# Patient Record
Sex: Female | Born: 1965 | Race: White | Hispanic: No | Marital: Married | Smoking: Never smoker
Health system: Southern US, Community
[De-identification: ages and names within clinical notes are randomized; demographics above are authoritative.]

## PROBLEM LIST (undated history)

## (undated) ENCOUNTER — Emergency Department (HOSPITAL_COMMUNITY)
Discharge status: Against Medical Advice | Payer: Medicaid Other | Attending: Emergency Medicine | Admitting: Emergency Medicine

## (undated) DIAGNOSIS — T8859XA Other complications of anesthesia, initial encounter: Secondary | ICD-10-CM

## (undated) DIAGNOSIS — F419 Anxiety disorder, unspecified: Secondary | ICD-10-CM

## (undated) DIAGNOSIS — R0600 Dyspnea, unspecified: Secondary | ICD-10-CM

## (undated) DIAGNOSIS — I2510 Atherosclerotic heart disease of native coronary artery without angina pectoris: Secondary | ICD-10-CM

## (undated) DIAGNOSIS — R569 Unspecified convulsions: Secondary | ICD-10-CM

## (undated) DIAGNOSIS — K219 Gastro-esophageal reflux disease without esophagitis: Secondary | ICD-10-CM

## (undated) DIAGNOSIS — G8929 Other chronic pain: Secondary | ICD-10-CM

## (undated) DIAGNOSIS — R7303 Prediabetes: Secondary | ICD-10-CM

## (undated) DIAGNOSIS — E7800 Pure hypercholesterolemia, unspecified: Secondary | ICD-10-CM

## (undated) DIAGNOSIS — F32A Depression, unspecified: Secondary | ICD-10-CM

## (undated) DIAGNOSIS — D332 Benign neoplasm of brain, unspecified: Secondary | ICD-10-CM

## (undated) DIAGNOSIS — F329 Major depressive disorder, single episode, unspecified: Secondary | ICD-10-CM

## (undated) DIAGNOSIS — K5792 Diverticulitis of intestine, part unspecified, without perforation or abscess without bleeding: Secondary | ICD-10-CM

## (undated) DIAGNOSIS — I10 Essential (primary) hypertension: Secondary | ICD-10-CM

## (undated) DIAGNOSIS — F1190 Opioid use, unspecified, uncomplicated: Secondary | ICD-10-CM

## (undated) HISTORY — PX: CARPAL TUNNEL RELEASE: SHX101

## (undated) HISTORY — PX: ABDOMINAL HYSTERECTOMY: SHX81

## (undated) HISTORY — PX: CHOLECYSTECTOMY: SHX55

## (undated) HISTORY — PX: TUBAL LIGATION: SHX77

## (undated) HISTORY — PX: ENDOMETRIAL ABLATION: SHX621

## (undated) HISTORY — PX: BRAIN TUMOR EXCISION: SHX577

---

## 2005-10-18 ENCOUNTER — Ambulatory Visit: Payer: Self-pay | Admitting: Family Medicine

## 2006-04-08 ENCOUNTER — Ambulatory Visit: Payer: Self-pay | Admitting: Family Medicine

## 2006-04-15 ENCOUNTER — Ambulatory Visit: Payer: Self-pay | Admitting: Family Medicine

## 2006-04-23 ENCOUNTER — Ambulatory Visit: Payer: Self-pay | Admitting: Family Medicine

## 2006-06-20 ENCOUNTER — Encounter (INDEPENDENT_AMBULATORY_CARE_PROVIDER_SITE_OTHER): Payer: Self-pay | Admitting: Specialist

## 2006-06-20 ENCOUNTER — Observation Stay (HOSPITAL_COMMUNITY): Admission: RE | Admit: 2006-06-20 | Discharge: 2006-06-22 | Payer: Self-pay | Admitting: Obstetrics and Gynecology

## 2006-06-24 ENCOUNTER — Ambulatory Visit: Payer: Self-pay | Admitting: Family Medicine

## 2010-05-23 ENCOUNTER — Emergency Department (HOSPITAL_COMMUNITY): Admission: EM | Admit: 2010-05-23 | Discharge: 2010-05-23 | Payer: Self-pay | Admitting: Emergency Medicine

## 2010-06-06 ENCOUNTER — Emergency Department (HOSPITAL_COMMUNITY): Admission: EM | Admit: 2010-06-06 | Discharge: 2010-06-06 | Payer: Self-pay | Admitting: Emergency Medicine

## 2010-06-07 ENCOUNTER — Emergency Department (HOSPITAL_COMMUNITY): Admission: EM | Admit: 2010-06-07 | Discharge: 2010-06-07 | Payer: Self-pay | Admitting: Family Medicine

## 2010-06-08 ENCOUNTER — Inpatient Hospital Stay (HOSPITAL_COMMUNITY): Admission: EM | Admit: 2010-06-08 | Discharge: 2010-06-09 | Payer: Self-pay | Admitting: Emergency Medicine

## 2010-12-17 ENCOUNTER — Emergency Department (HOSPITAL_COMMUNITY)
Admission: EM | Admit: 2010-12-17 | Discharge: 2010-12-17 | Payer: Self-pay | Source: Home / Self Care | Admitting: Emergency Medicine

## 2010-12-25 LAB — COMPREHENSIVE METABOLIC PANEL
ALT: 23 U/L (ref 0–35)
AST: 23 U/L (ref 0–37)
Albumin: 3.5 g/dL (ref 3.5–5.2)
Alkaline Phosphatase: 55 U/L (ref 39–117)
BUN: 14 mg/dL (ref 6–23)
CO2: 26 mEq/L (ref 19–32)
Calcium: 8.4 mg/dL (ref 8.4–10.5)
Chloride: 103 mEq/L (ref 96–112)
Creatinine, Ser: 0.75 mg/dL (ref 0.4–1.2)
GFR calc Af Amer: >60 mL/min (ref >60)
GFR calc non Af Amer: >60 mL/min (ref >60)
Glucose, Bld: 94 mg/dL (ref 70–99)
Potassium: 4.3 mEq/L (ref 3.5–5.1)
Sodium: 136 mEq/L (ref 135–145)
Total Bilirubin: 0.4 mg/dL (ref 0.3–1.2)
Total Protein: 6.2 g/dL (ref 6.0–8.3)

## 2010-12-25 LAB — URINALYSIS, ROUTINE W REFLEX MICROSCOPIC
Bilirubin Urine: NEGATIVE
Hgb urine dipstick: NEGATIVE
Ketones, ur: NEGATIVE mg/dL
Nitrite: NEGATIVE
Protein, ur: NEGATIVE mg/dL
Specific Gravity, Urine: >1.03 — ABNORMAL HIGH (ref 1.005–1.030)
Urine Glucose, Fasting: NEGATIVE mg/dL
Urobilinogen, UA: 0.2 mg/dL (ref 0.0–1.0)
pH: 5.5 (ref 5.0–8.0)

## 2010-12-25 LAB — DIFFERENTIAL
Basophils Absolute: 0.1 10*3/uL (ref 0.0–0.1)
Basophils Relative: 1 % (ref 0–1)
Eosinophils Absolute: 0.1 10*3/uL (ref 0.0–0.7)
Eosinophils Relative: 1 % (ref 0–5)
Lymphocytes Relative: 37 % (ref 12–46)
Lymphs Abs: 2.4 10*3/uL (ref 0.7–4.0)
Monocytes Absolute: 0.4 10*3/uL (ref 0.1–1.0)
Monocytes Relative: 6 % (ref 3–12)
Neutro Abs: 3.5 10*3/uL (ref 1.7–7.7)
Neutrophils Relative %: 54 % (ref 43–77)

## 2010-12-25 LAB — CBC
HCT: 37.9 % (ref 36.0–46.0)
Hemoglobin: 13.6 g/dL (ref 12.0–15.0)
MCH: 30.4 pg (ref 26.0–34.0)
MCHC: 35.9 g/dL (ref 30.0–36.0)
MCV: 84.8 fL (ref 78.0–100.0)
Platelets: 195 10*3/uL (ref 150–400)
RBC: 4.47 MIL/uL (ref 3.87–5.11)
RDW: 13 % (ref 11.5–15.5)
WBC: 6.5 10*3/uL (ref 4.0–10.5)

## 2010-12-25 LAB — GLUCOSE, CAPILLARY: Glucose-Capillary: 98 mg/dL (ref 70–99)

## 2011-02-25 LAB — BASIC METABOLIC PANEL
BUN: 7 mg/dL (ref 6–23)
BUN: 13 mg/dL (ref 6–23)
CO2: 28 mEq/L (ref 19–32)
CO2: 26 mEq/L (ref 19–32)
Calcium: 8.2 mg/dL — ABNORMAL LOW (ref 8.4–10.5)
Calcium: 9.2 mg/dL (ref 8.4–10.5)
Chloride: 106 mEq/L (ref 96–112)
Chloride: 105 mEq/L (ref 96–112)
Creatinine, Ser: 0.83 mg/dL (ref 0.4–1.2)
Creatinine, Ser: 0.66 mg/dL (ref 0.4–1.2)
GFR calc Af Amer: >60 mL/min (ref >60)
GFR calc Af Amer: >60 mL/min (ref >60)
GFR calc non Af Amer: >60 mL/min (ref >60)
GFR calc non Af Amer: >60 mL/min (ref >60)
Glucose, Bld: 103 mg/dL — ABNORMAL HIGH (ref 70–99)
Glucose, Bld: 97 mg/dL (ref 70–99)
Potassium: 3.9 mEq/L (ref 3.5–5.1)
Potassium: 3.9 mEq/L (ref 3.5–5.1)
Sodium: 140 mEq/L (ref 135–145)
Sodium: 136 mEq/L (ref 135–145)

## 2011-02-25 LAB — CBC
HCT: 35.6 % — ABNORMAL LOW (ref 36.0–46.0)
HCT: 41.8 % (ref 36.0–46.0)
HCT: 36.8 % (ref 36.0–46.0)
HCT: 42.8 % (ref 36.0–46.0)
Hemoglobin: 12.4 g/dL (ref 12.0–15.0)
Hemoglobin: 14.4 g/dL (ref 12.0–15.0)
Hemoglobin: 12.9 g/dL (ref 12.0–15.0)
Hemoglobin: 14.7 g/dL (ref 12.0–15.0)
MCH: 30.8 pg (ref 26.0–34.0)
MCH: 29.8 pg (ref 26.0–34.0)
MCH: 30.1 pg (ref 26.0–34.0)
MCH: 30.6 pg (ref 26.0–34.0)
MCHC: 34.9 g/dL (ref 30.0–36.0)
MCHC: 34.4 g/dL (ref 30.0–36.0)
MCHC: 34.2 g/dL (ref 30.0–36.0)
MCHC: 34.9 g/dL (ref 30.0–36.0)
MCV: 88.4 fL (ref 78.0–100.0)
MCV: 86.4 fL (ref 78.0–100.0)
MCV: 87.6 fL (ref 78.0–100.0)
MCV: 87.9 fL (ref 78.0–100.0)
Platelets: 167 10*3/uL (ref 150–400)
Platelets: 215 10*3/uL (ref 150–400)
Platelets: 161 10*3/uL (ref 150–400)
Platelets: 213 10*3/uL (ref 150–400)
RBC: 4.03 MIL/uL (ref 3.87–5.11)
RBC: 4.84 MIL/uL (ref 3.87–5.11)
RBC: 4.21 MIL/uL (ref 3.87–5.11)
RBC: 4.87 MIL/uL (ref 3.87–5.11)
RDW: 13.3 % (ref 11.5–15.5)
RDW: 13.4 % (ref 11.5–15.5)
RDW: 13.3 % (ref 11.5–15.5)
RDW: 13.5 % (ref 11.5–15.5)
WBC: 6.2 10*3/uL (ref 4.0–10.5)
WBC: 8.4 10*3/uL (ref 4.0–10.5)
WBC: 14.1 10*3/uL — ABNORMAL HIGH (ref 4.0–10.5)
WBC: 8.9 10*3/uL (ref 4.0–10.5)

## 2011-02-25 LAB — COMPREHENSIVE METABOLIC PANEL
ALT: 27 U/L (ref 0–35)
ALT: 31 U/L (ref 0–35)
AST: 31 U/L (ref 0–37)
AST: 37 U/L (ref 0–37)
Albumin: 3.3 g/dL — ABNORMAL LOW (ref 3.5–5.2)
Albumin: 4.1 g/dL (ref 3.5–5.2)
Alkaline Phosphatase: 62 U/L (ref 39–117)
Alkaline Phosphatase: 66 U/L (ref 39–117)
BUN: 11 mg/dL (ref 6–23)
BUN: 9 mg/dL (ref 6–23)
CO2: 23 mEq/L (ref 19–32)
CO2: 27 mEq/L (ref 19–32)
Calcium: 8.2 mg/dL — ABNORMAL LOW (ref 8.4–10.5)
Calcium: 9 mg/dL (ref 8.4–10.5)
Chloride: 100 mEq/L (ref 96–112)
Chloride: 101 mEq/L (ref 96–112)
Creatinine, Ser: 0.79 mg/dL (ref 0.4–1.2)
Creatinine, Ser: 0.8 mg/dL (ref 0.4–1.2)
GFR calc Af Amer: >60 mL/min (ref >60)
GFR calc Af Amer: >60 mL/min (ref >60)
GFR calc non Af Amer: >60 mL/min (ref >60)
GFR calc non Af Amer: >60 mL/min (ref >60)
Glucose, Bld: 107 mg/dL — ABNORMAL HIGH (ref 70–99)
Glucose, Bld: 97 mg/dL (ref 70–99)
Potassium: 3.6 mEq/L (ref 3.5–5.1)
Potassium: 4.8 mEq/L (ref 3.5–5.1)
Sodium: 133 mEq/L — ABNORMAL LOW (ref 135–145)
Sodium: 135 mEq/L (ref 135–145)
Total Bilirubin: 1.1 mg/dL (ref 0.3–1.2)
Total Bilirubin: 1.7 mg/dL — ABNORMAL HIGH (ref 0.3–1.2)
Total Protein: 6.3 g/dL (ref 6.0–8.3)
Total Protein: 7.2 g/dL (ref 6.0–8.3)

## 2011-02-25 LAB — URINALYSIS, ROUTINE W REFLEX MICROSCOPIC
Bilirubin Urine: NEGATIVE
Glucose, UA: NEGATIVE mg/dL
Glucose, UA: NEGATIVE mg/dL
Hgb urine dipstick: NEGATIVE
Hgb urine dipstick: NEGATIVE
Ketones, ur: NEGATIVE mg/dL
Ketones, ur: 15 mg/dL — AB
Nitrite: NEGATIVE
Nitrite: NEGATIVE
Protein, ur: NEGATIVE mg/dL
Protein, ur: NEGATIVE mg/dL
Specific Gravity, Urine: >1.03 — ABNORMAL HIGH (ref 1.005–1.030)
Specific Gravity, Urine: 1.028 (ref 1.005–1.030)
Urobilinogen, UA: 0.2 mg/dL (ref 0.0–1.0)
Urobilinogen, UA: 0.2 mg/dL (ref 0.0–1.0)
pH: 5.5 (ref 5.0–8.0)
pH: 5.5 (ref 5.0–8.0)

## 2011-02-25 LAB — DIFFERENTIAL
Basophils Absolute: 0.1 10*3/uL (ref 0.0–0.1)
Basophils Absolute: 0 10*3/uL (ref 0.0–0.1)
Basophils Absolute: 0.1 10*3/uL (ref 0.0–0.1)
Basophils Relative: 1 % (ref 0–1)
Basophils Relative: 1 % (ref 0–1)
Basophils Relative: 1 % (ref 0–1)
Eosinophils Absolute: 0.1 10*3/uL (ref 0.0–0.7)
Eosinophils Absolute: 0 10*3/uL (ref 0.0–0.7)
Eosinophils Absolute: 0 10*3/uL (ref 0.0–0.7)
Eosinophils Relative: 1 % (ref 0–5)
Eosinophils Relative: 0 % (ref 0–5)
Eosinophils Relative: 1 % (ref 0–5)
Lymphocytes Relative: 27 % (ref 12–46)
Lymphocytes Relative: 11 % — ABNORMAL LOW (ref 12–46)
Lymphocytes Relative: 22 % (ref 12–46)
Lymphs Abs: 2.2 10*3/uL (ref 0.7–4.0)
Lymphs Abs: 1.5 10*3/uL (ref 0.7–4.0)
Lymphs Abs: 2 10*3/uL (ref 0.7–4.0)
Monocytes Absolute: 0.6 10*3/uL (ref 0.1–1.0)
Monocytes Absolute: 0.6 10*3/uL (ref 0.1–1.0)
Monocytes Absolute: 0.6 10*3/uL (ref 0.1–1.0)
Monocytes Relative: 7 % (ref 3–12)
Monocytes Relative: 4 % (ref 3–12)
Monocytes Relative: 7 % (ref 3–12)
Neutro Abs: 5.5 10*3/uL (ref 1.7–7.7)
Neutro Abs: 11.9 10*3/uL — ABNORMAL HIGH (ref 1.7–7.7)
Neutro Abs: 6.2 10*3/uL (ref 1.7–7.7)
Neutrophils Relative %: 65 % (ref 43–77)
Neutrophils Relative %: 70 % (ref 43–77)
Neutrophils Relative %: 85 % — ABNORMAL HIGH (ref 43–77)

## 2011-02-25 LAB — URINE CULTURE: Colony Count: 50000

## 2011-02-25 LAB — LIPASE, BLOOD: Lipase: 25 U/L (ref 11–59)

## 2011-02-26 LAB — COMPREHENSIVE METABOLIC PANEL
ALT: 27 U/L (ref 0–35)
Alkaline Phosphatase: 67 U/L (ref 39–117)
CO2: 22 mEq/L (ref 19–32)
Chloride: 105 mEq/L (ref 96–112)
Glucose, Bld: 91 mg/dL (ref 70–99)
Potassium: 3.8 mEq/L (ref 3.5–5.1)
Sodium: 137 mEq/L (ref 135–145)
Total Bilirubin: 0.7 mg/dL (ref 0.3–1.2)
Total Protein: 7 g/dL (ref 6.0–8.3)

## 2011-02-26 LAB — CBC
Hemoglobin: 14.4 g/dL (ref 12.0–15.0)
RBC: 4.79 MIL/uL (ref 3.87–5.11)
WBC: 7.4 10*3/uL (ref 4.0–10.5)

## 2011-02-26 LAB — URINALYSIS, ROUTINE W REFLEX MICROSCOPIC
Bilirubin Urine: NEGATIVE
Glucose, UA: NEGATIVE mg/dL
Hgb urine dipstick: NEGATIVE
Ketones, ur: NEGATIVE mg/dL
Nitrite: NEGATIVE
Protein, ur: NEGATIVE mg/dL
Specific Gravity, Urine: >1.03 — ABNORMAL HIGH (ref 1.005–1.030)
Urobilinogen, UA: 0.2 mg/dL (ref 0.0–1.0)
pH: 5 (ref 5.0–8.0)

## 2011-02-26 LAB — POCT CARDIAC MARKERS
CKMB, poc: <1 ng/mL — ABNORMAL LOW (ref 1.0–8.0)
Myoglobin, poc: 60.2 ng/mL (ref 12–200)
Troponin i, poc: <0.05 ng/mL (ref 0.00–0.09)

## 2011-02-26 LAB — DIFFERENTIAL
Basophils Relative: 1 % (ref 0–1)
Eosinophils Absolute: 0.1 10*3/uL (ref 0.0–0.7)
Monocytes Relative: 6 % (ref 3–12)
Neutrophils Relative %: 67 % (ref 43–77)

## 2011-02-26 LAB — GC/CHLAMYDIA PROBE AMP, GENITAL: GC Probe Amp, Genital: NEGATIVE

## 2011-02-26 LAB — WET PREP, GENITAL

## 2011-04-06 IMAGING — CT CT ABD-PELV W/ CM
3 of 5 series · 15 of 32 positions shown, 19 images · IV contrast (water & 100 ML OMNI 300)
Comparison: 05/23/2010

CLINICAL DATA: Left-sided abdominal pain.

CT ABDOMEN AND PELVIS WITH CONTRAST
TECHNIQUE: Multidetector CT imaging of the abdomen and pelvis was
performed following the standard protocol during bolus
administration of intravenous contrast.
Contrast: 100 ml Omnipaque 300 IV.

[Series 2: routine abdomen · axial · 0.81mm/px · z∈[-390,-130]mm · 3 of 102 slices shown, 7 images]
[im 26/102  soft-tissue]
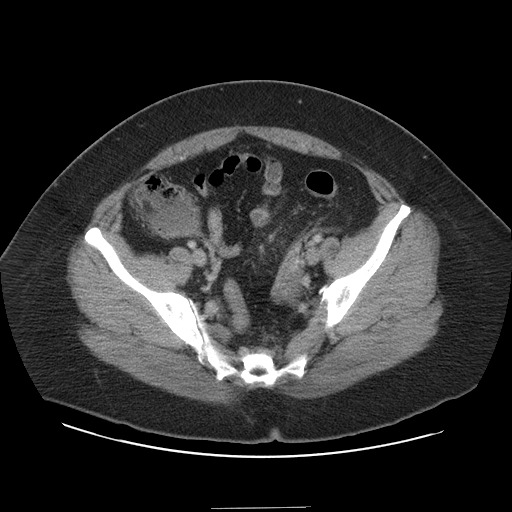
[im 26/102  lung]
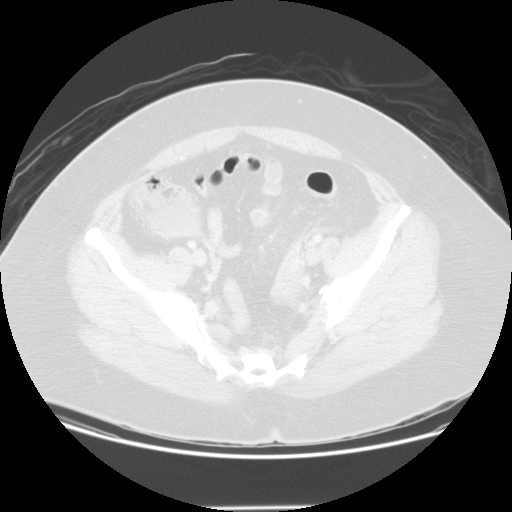
[im 26/102  bone]
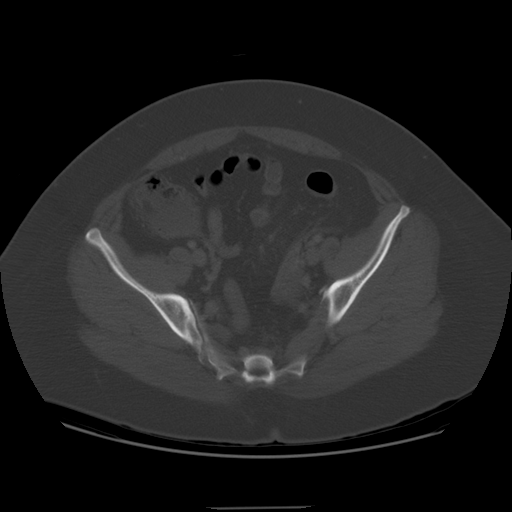
[im 51/102  soft-tissue]
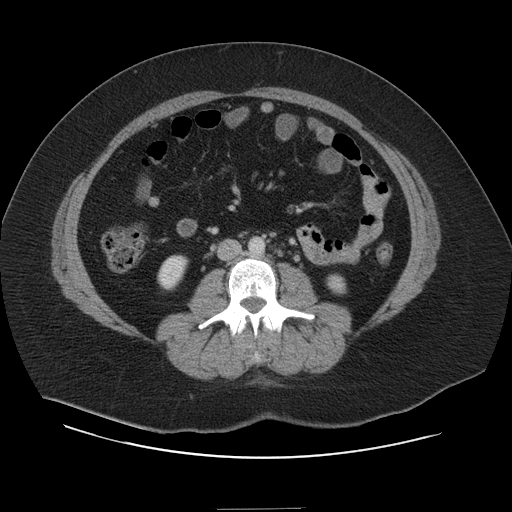
[im 51/102  lung]
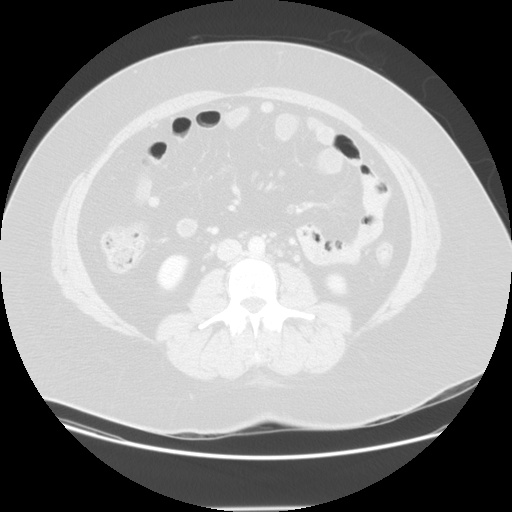
[im 76/102  soft-tissue]
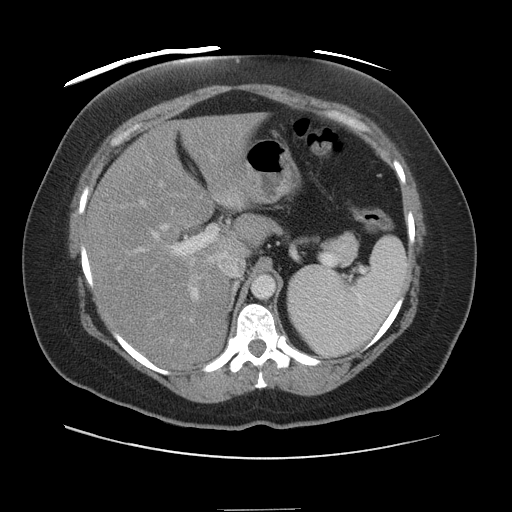
[im 76/102  lung]
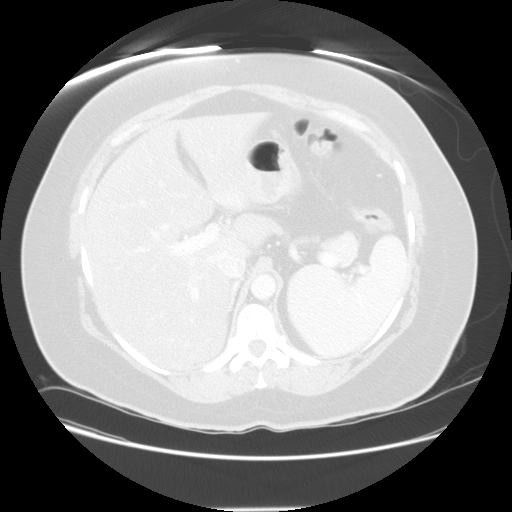

[Series 400: reformatted · sagittal · 1.06mm/px · 8 of 206 slices shown (1 of 2)]
[im 19/206  soft-tissue]
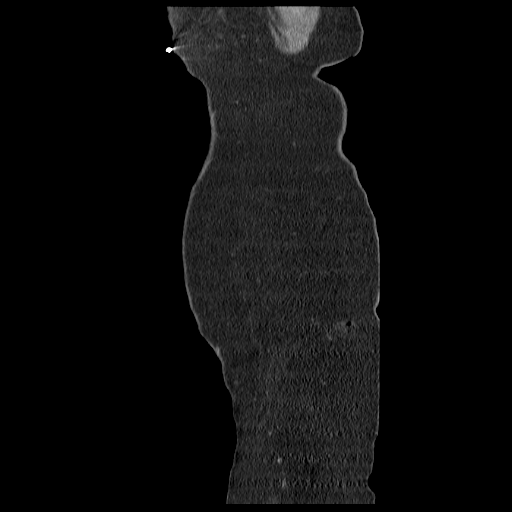
[im 38/206  soft-tissue]
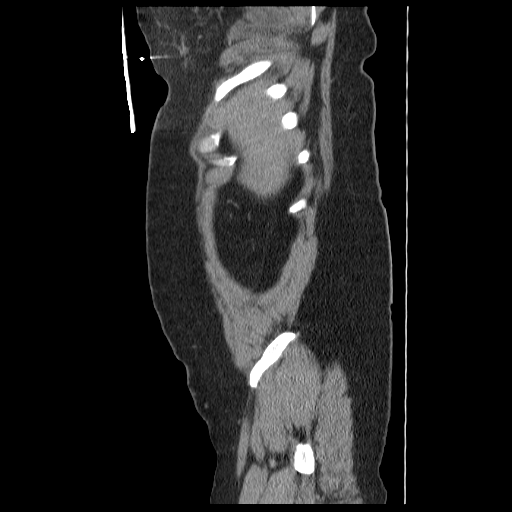
[im 75/206  soft-tissue]
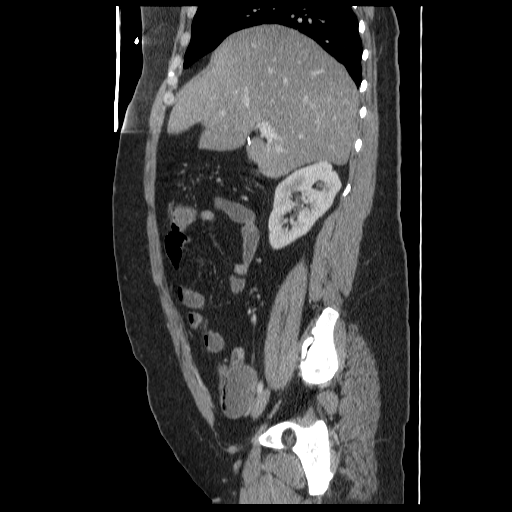
[im 94/206  soft-tissue]
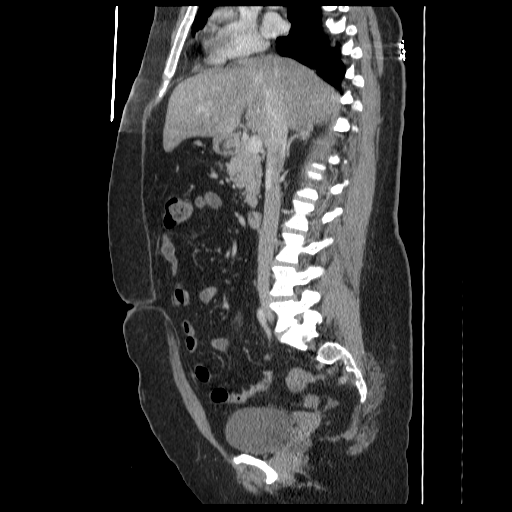
[im 112/206  soft-tissue]
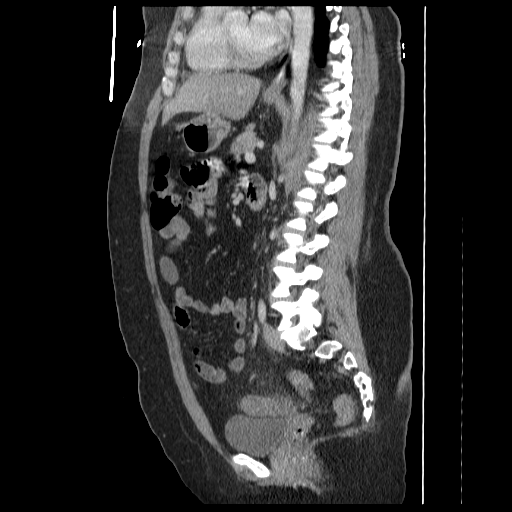
[im 131/206  soft-tissue]
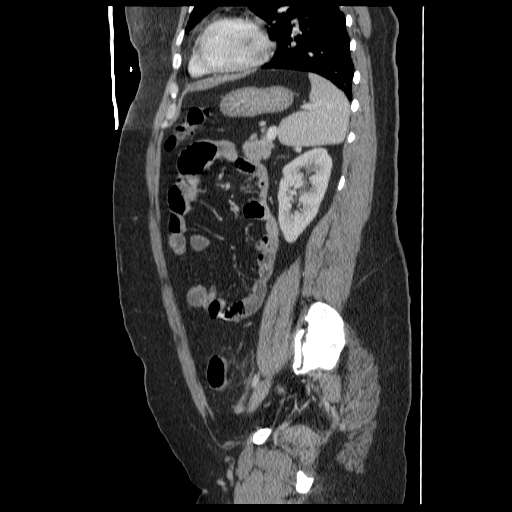
[im 168/206  soft-tissue]
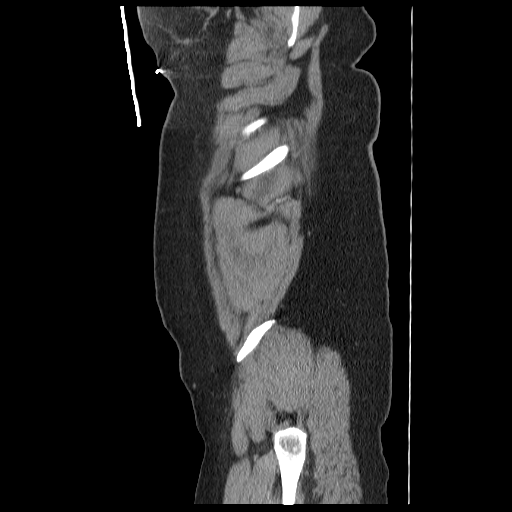
[im 187/206  soft-tissue]
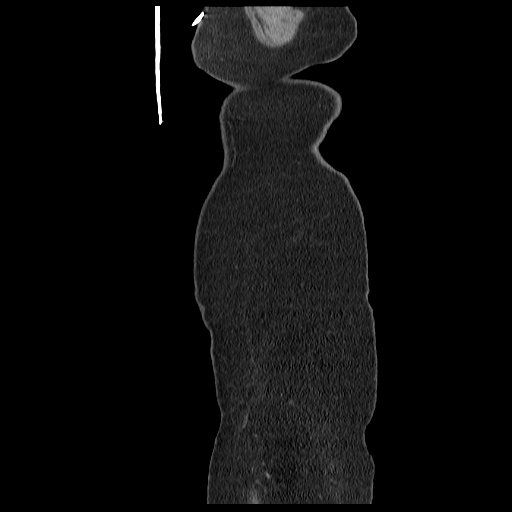

[Series 401: reformatted · coronal · 1.06mm/px · 4 of 176 slices shown (2 of 2)]
[im 20/176  soft-tissue]
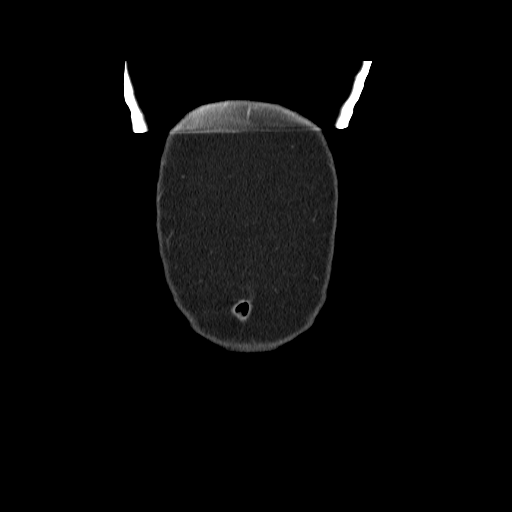
[im 39/176  soft-tissue]
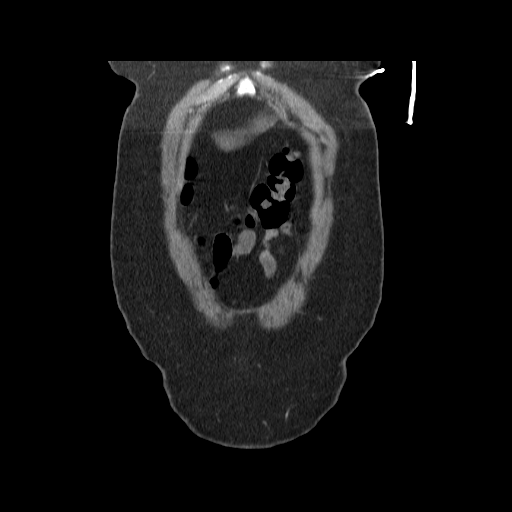
[im 59/176  soft-tissue]
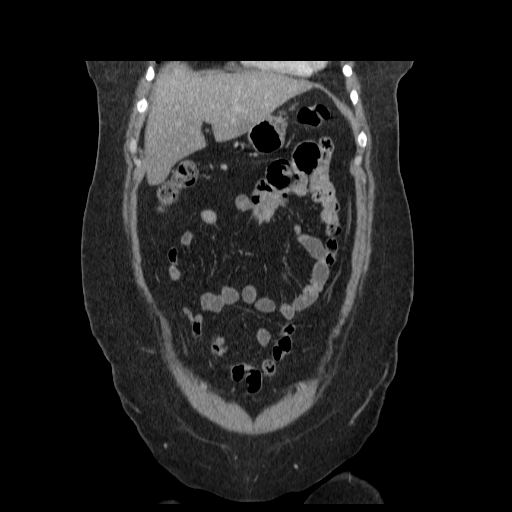
[im 78/176  soft-tissue]
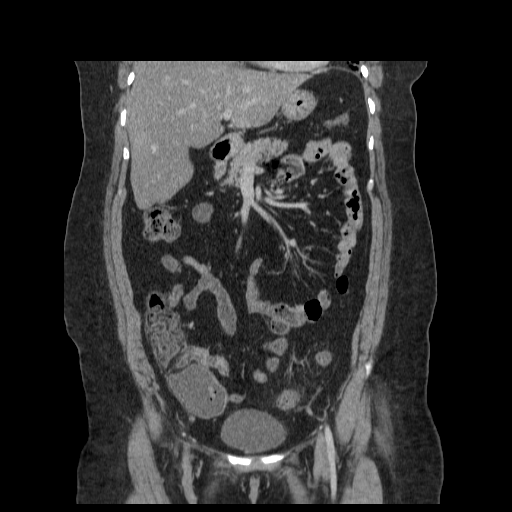

[15 of 32 positions shown; findings below may reference images not displayed]

FINDINGS: Minimal dependent atelectasis in the lung bases.  No
effusions.

There is fatty infiltration of the liver.  Small low-density lesion
is seen anteriorly in the liver, likely small cyst, unchanged.
Prior cholecystectomy.  Spleen, pancreas, adrenals and kidneys are
unremarkable.

There is sigmoid diverticulosis.  The previously seen inflammatory
change around the proximal sigmoid colon has improved.  More
distally in the mid sigmoid colon, there is new inflammatory change
compatible with a new area of diverticulitis.  No free fluid or
free air.

Prior hysterectomy.  Small left ovarian cyst present, similar prior
study.  Right ovary is unremarkable.  Small bowel is decompressed,
grossly unremarkable.

Aorta is normal caliber. No acute bony abnormality.
IMPRESSION: There is a new area of diverticulitis more distally in the sigmoid
colon with surrounding inflammatory change.  No abscess or free
air.

Fatty infiltration of the liver.

## 2011-04-09 ENCOUNTER — Emergency Department (HOSPITAL_COMMUNITY)
Admission: EM | Admit: 2011-04-09 | Discharge: 2011-04-10 | Disposition: A | Discharge status: Home / Self Care | Payer: Medicaid Other | Attending: Emergency Medicine | Admitting: Emergency Medicine

## 2011-04-09 DIAGNOSIS — M542 Cervicalgia: Secondary | ICD-10-CM | POA: Insufficient documentation

## 2011-04-09 DIAGNOSIS — R51 Headache: Secondary | ICD-10-CM | POA: Insufficient documentation

## 2011-04-09 DIAGNOSIS — F329 Major depressive disorder, single episode, unspecified: Secondary | ICD-10-CM | POA: Insufficient documentation

## 2011-04-09 DIAGNOSIS — Z9889 Other specified postprocedural states: Secondary | ICD-10-CM | POA: Insufficient documentation

## 2011-04-09 DIAGNOSIS — M25519 Pain in unspecified shoulder: Secondary | ICD-10-CM | POA: Insufficient documentation

## 2011-04-09 DIAGNOSIS — E785 Hyperlipidemia, unspecified: Secondary | ICD-10-CM | POA: Insufficient documentation

## 2011-04-09 DIAGNOSIS — T148XXA Other injury of unspecified body region, initial encounter: Secondary | ICD-10-CM | POA: Insufficient documentation

## 2011-04-09 DIAGNOSIS — Y929 Unspecified place or not applicable: Secondary | ICD-10-CM | POA: Insufficient documentation

## 2011-04-09 DIAGNOSIS — X58XXXA Exposure to other specified factors, initial encounter: Secondary | ICD-10-CM | POA: Insufficient documentation

## 2011-04-09 DIAGNOSIS — F3289 Other specified depressive episodes: Secondary | ICD-10-CM | POA: Insufficient documentation

## 2011-04-09 LAB — DIFFERENTIAL
Basophils Absolute: 0.1 10*3/uL (ref 0.0–0.1)
Lymphocytes Relative: 43 % (ref 12–46)
Monocytes Absolute: 0.4 10*3/uL (ref 0.1–1.0)
Neutro Abs: 3.1 10*3/uL (ref 1.7–7.7)

## 2011-04-09 LAB — BASIC METABOLIC PANEL
CO2: 26 mEq/L (ref 19–32)
Chloride: 103 mEq/L (ref 96–112)
Glucose, Bld: 87 mg/dL (ref 70–99)
Potassium: 3.4 mEq/L — ABNORMAL LOW (ref 3.5–5.1)
Sodium: 137 mEq/L (ref 135–145)

## 2011-04-09 LAB — CBC
HCT: 37.9 % (ref 36.0–46.0)
Hemoglobin: 13.2 g/dL (ref 12.0–15.0)
WBC: 6.5 10*3/uL (ref 4.0–10.5)

## 2011-04-27 NOTE — Op Note (Signed)
[name], [name]              ACCOUNT NO.:  [id number]   MEDICAL RECORD NO.:  [id number]          PATIENT TYPE:  OBV   LOCATION:  A428                          FACILITY:  [facility]   PHYSICIAN:  Tilda Burrow, M.D. DATE OF BIRTH:  01/25/1966   DATE OF PROCEDURE:  06/20/2006  DATE OF DISCHARGE:                                 OPERATIVE REPORT   PREOPERATIVE DIAGNOSIS:  Dyspareunia and pelvic pain.   POSTOPERATIVE DIAGNOSIS:  Dyspareunia and pelvic pain.   OPERATION PERFORMED:  Laparoscopically assisted vaginal hysterectomy.   SURGEON:  Tilda Burrow, M.D.   ASSISTANT:  Amie Critchley and Babb.   ANESTHESIA:  General.   COMPLICATIONS:  Oozing from uterine surfaces while accessing the uterus per  vagina.   ESTIMATED BLOOD LOSS:  650 mL.   DRAINS:  None.   DESCRIPTION OF PROCEDURE:  The patient was taken to the operating room and  prepped and draped for combined abdominal and vaginal procedure.  An  infraumbilical vertical 1 cm incision was made and Veress needle introduced  through the umbilicus while elevating the abdominal wall.  Water droplet  test confirmed intraperitoneal location and a pneumoperitoneum was easily  achieved under normal pressures.  The laparoscopic trocar was introduced  under direct visualization using the video camera, and then the abdomen was  inspected, no visual trauma to abdominal contents noted.  Suprapubic trocar  was introduced 5 mm under direct visualization and a right lower quadrant  trocar placed as well.  The patient was placed in Trendelenburg position.  Foley catheter was already in place and the uterus held with a Hulka  tenaculum.  The cervix had previously had an anterior incision made beneath  the bladder flap to elevate the bladder slightly.  This was in the anterior  cervicovaginal fornix about 2 cm wide.   The uterus was identified, able to be elevated and bowel which was attached  quite strongly at the left pelvic brim at the  level of the sigmoid was able  to be retracted out of the way.  The round ligaments were then identified  and grasped with the Harmonic scalpel and then transected.  The ovaries were  inspected.  The right ovary was normal in size. The left ovary was smooth,  cystic with no ovulation stigma present but slightly enlarged, felt within  normal limits considering that she was not ovulating rectally.  We then  crossclamped the fallopian tube, transected with Harmonic scalpel and then  marched across the right utero-ovarian ligament without difficulty clamping  and then transecting with Harmonic scalpel.  The bladder flap was then  developed anteriorly, elevating the bladder flap and peeling it free from  the lower uterine segment.   The left side was then addressed by performing similar technique, however,  the enlarged ovary made it technically a little bit challenging.  Access was  more difficult.  We did march down but with success except that the uterus  tried to have more back bleeding on the left side.  This required several efforts at Harmonic scalpel coagulation to control the  oozing from this  size.  At the end, nonetheless, the uterus was considered  stable to move to the vagina for continuation of the procedure.  The abdomen  was deflated.  Once laparoscopic equipment was removed, the sleeves left in  placed.  We then went per vagina.  The patient had legs raised into the high  lithotomy position using the adjustable yellow fins and then the anterior  incision in the cervicovaginal fornix was expanded and then the posterior  colpotomy incision performed.  It was obvious that there was increased  oozing intra-abdominally.  Access was slightly challenging due to thick  perineal body and less than ideal descent of the cervix.  The uterosacral  ligaments could be clamped, cut and suture ligated on either side.  The  lower cardinal ligaments were then clamped, cut and suture ligated as  well.  The upper cardinal ligaments were then clamped, cut and suture ligated after  we were able to complete digital penetration into the fornix as well. We  then marched up the lower lateral sides of the vagina, clamping, cutting and  suture ligating.  She had a tendency to ooze from all surfaces including the  cuff edges but it was particularly apparent that there was suspected back  bleeding from the previous laparoscopic procedures.  This was controlled  once the pedicles were completely crossclamped from below.  The uterus was  removed.  Pedicles inspected.  On the left side there was one small area of  the lower cardinal ligament that required resuturing.  The pelvis was  suctioned and blood loss estimated about 650 mL.  By this time the bleeding  had essentially stopped.  The anterior peritoneum was pulled down to cover  over the pelvic floor.  The uterosacral ligament pedicles were sutured to  the posterior cuff and situated to elevate the posterior cuff and reduce the  potential for enterocele.  The remaining portion of the cuff was then closed  using interrupted 0 chromic sutures, front to back.  At the end of the  procedure, the reapproximation was quite good.   We then went back up and reinsufflated the abdomen, reinserted camera and  laparoscopic instruments, manipulated bowel with the patient in  Trendelenburg position and confirmed that the pedicles were adequately  hemostatic.  Photo #4 was taken to document the post surgical status after  watching the pelvis for some time having irrigated out and inspected it  carefully.  The patient tolerated the procedure well, went to recovery room  in good condition.  There was no hemodynamic changes considered unstable  despite the increased blood loss of 650 mL.      Tilda Burrow, M.D.  Electronically Signed     JVF/MEDQ  D:  06/22/2006  T:  06/22/2006  Job:  [id number]

## 2011-04-27 NOTE — H&P (Signed)
[name], [name]              ACCOUNT NO.:  [id number]   MEDICAL RECORD NO.:  [id number]          PATIENT TYPE:  AMB   LOCATION:  DAY                           FACILITY:  [facility]   PHYSICIAN:  Tilda Burrow, M.D. DATE OF BIRTH:  06/20/1966   DATE OF ADMISSION:  DATE OF DISCHARGE:  LH                                HISTORY & PHYSICAL   ADMISSION DIAGNOSIS:  Dyspareunia secondary to uterine contact pelvic pain.   HISTORY OF PRESENT ILLNESS:  This is a 45 year old female was admitted for  bad stomach cramps and lots of other aches.  She hurts during intercourse.  The patient is brought to have tears by the discomfort.  She has clear  discharge and no odor.  She has been referred to our office courtesy of Dr.  Jeanella Flattery, from [place].  She has been evaluated at [facility] with  a transvaginal ultrasound which shows a retroverted uterus, 672 cm estimated  volume, and only one tiny fibroid.  The endometrium was slightly irregular.  A question of adenomyosis was raised and had MRI suggested.  This was done,  of course, and was negative.  There was no evidence of adenomyosis or  abnormality.  Clinical exam shows a normal white count.  The sed rate was  not performed.  The clinical exam shows adequate vaginal length, a tender  first degree uterine descensus of cervix with mid position, nonpurulent,  retroverted uterus that is 3+ to 4+ tender to contact.  There are no masses,  no cul-de-sac discomfort.  No cul-de-sac masses felt.  The clinical  diagnosis is dyspareunia and suspected adenomyosis.  After discussion of  treatment options, and recognition of prior treatments already attempted, we  are proceeding toward laparoscopically assisted vaginal hysterectomy.  Goal  was to preserve her ovaries.  If endometriosis was identified, the patient  acknowledges that ovarian removal may be indicated, and she authorizes this.  Laparoscopy and hysterectomy have both been reviewed using  [facility] medical  explainer.  She understands hysterectomy will render her permanently  incapable of childbearing.   PAST MEDICAL HISTORY:  Benign.   SURGICAL HISTORY:  Endometrial ablation, which has eliminated periods and  explains the thin endometrium identified on ultrasound and MRI.  Surgical  history is dysplasia of the cervix in 1995 with negative follow up.  Pap  smear class I recently in the office.  Surgical history also includes tubal  ligation in 1995 and cholecystectomy in 1995 at [facility].   ALLERGIES:  Tylenol #3 and Darvocet.   Height 5-6, weight 196, blood pressure 112/78, pulse 70s.  Pupils equal,  round, reactive.  Neck supple.  Trachea midline.  Cardiovascular exam is  unremarkable.  Breasts exam deferred.  ABDOMEN:  Lower abdominal ache without masses, guarding or rebound.  No  adenopathy.  EXTERNAL GENITALIA:  Normal with good vaginal length, tender, mid position  3+ to 4+ sensitivity to the  nonpurulent cervix.  Uterus retroverted.  There are no masses other than  uterus which can be palpated in the cul-de-sac.   PLAN:  Laparoscopically-assisted vaginal hysterectomy with evaluation of  ovaries, to be performed June 20, 2006.      Tilda Burrow, M.D.  Electronically Signed     JVF/MEDQ  D:  06/18/2006  T:  06/18/2006  Job:  [id number]   cc:   Delaney Meigs, M.D.  Fax: [phone]

## 2011-04-27 NOTE — Discharge Summary (Signed)
[name], [name]              ACCOUNT NO.:  [id number]   MEDICAL RECORD NO.:  [id number]          PATIENT TYPE:  OBV   LOCATION:  A428                          FACILITY:  [facility]   PHYSICIAN:  Tilda Burrow, M.D. DATE OF BIRTH:  17-Aug-1966   DATE OF ADMISSION:  06/20/2006  DATE OF DISCHARGE:  LH                                 DISCHARGE SUMMARY   ADMITTING DIAGNOSIS:  Dyspareunia, pelvic pain.   DISCHARGE DIAGNOSES:  1.  Dyspareunia, pelvic pain.  2.  Anxiety disorder.   POSTOPERATIVE DIAGNOSIS:  Intraoperative blood loss 650 mL.   HISTORY OF PRESENT ILLNESS:  This 45 year old female admitted for  laparoscopic-assisted vaginal hysterectomy due to uterine contact pelvic  pain.  She has had lots of pelvic tenderness.  Past medical history benign  other than anxiety.  Family history positive for difficult social situation  with second husband uninvolved in the children, current spouse supportive  but works a lot and first child suffering from autism of moderate severity.   Height 5 feet 6 inches, weight 196, blood pressure 112/78, pulse 70s.  Admitting hemoglobin of 14.1, hematocrit 39.6.   HOSPITAL COURSE:  The patient was admitted, underwent laparoscopic-assisted  vaginal hysterectomy with 650-mL blood loss attributed to back bleeding  during the vaginal portion of the procedure.  Post surgically she had a  hematocrit of 35% shortly after the surgery, and then equilibration at 32.2%  on postoperative day #1.  She had greater than usual back pain in the lumbar  area, similar to where she experienced her menstrual discomfort and  dyspareunia.  She had bowel sounds present.  She had gradual improvement in  abdominal discomfort.  Considered going home late on postoperative day #1  but stayed for reassurance until July 14 where she was considered stable for  discharge.   DISCHARGE MEDICATIONS:  1.  Tylox #20 tablets one q.4h. p.r.n. pain.  2.  Iron sulfate 325 mg tablets twice  daily x1 month.  3.  Motrin 800 mg one three times daily for 1 week and then p.r.n.  4.  Xanax 1 mg twice daily.  The patient encouraged to reduce dosage if      possible.   FOLLOWUP:  She will follow up in 4 days for removal of staples.  Routine  post surgical instructions reviewed.      Tilda Burrow, M.D.  Electronically Signed     JVF/MEDQ  D:  06/22/2006  T:  06/22/2006  Job:  [id number]

## 2011-04-27 NOTE — Op Note (Signed)
[name], [name]              ACCOUNT NO.:  [id number]   MEDICAL RECORD NO.:  [id number]          PATIENT TYPE:  OBV   LOCATION:  A428                          FACILITY:  [facility]   PHYSICIAN:  Tilda Burrow, M.D. DATE OF BIRTH:  1966-06-22   DATE OF PROCEDURE:  06/20/2006  DATE OF DISCHARGE:  06/22/2006                                 OPERATIVE REPORT   PREOPERATIVE DIAGNOSES:  Dyspareunia, secondary to uterine contact pelvic  pain.   POSTOPERATIVE DIAGNOSES:  Dyspareunia, secondary to uterine contact pelvic  pain.   PROCEDURE:  Laparoscopically-assisted vaginal hysterectomy.   SURGEON:  Christin Bach, M.D.   ASSISTANT:  None.   ANESTHESIA:  General.   COMPLICATIONS:  None.   FINDINGS:  Mobile uterus, normal tubes and ovaries.   DETAILS OF PROCEDURE:  The patient was taken to the operating room, prepped  and draped for combined abdominal and vaginal procedure.  The infraumbilical  vertical, 1-cm skin incision was performed, as well as a transverse  suprapubic and right lower quadrant trocar sites initiated.  A Veress needle  was inserted through the umbilicus, water droplet technique used to confirm  intraperitoneal location and then pneumoperitoneum achieved under 2 L CO2.  The laparoscopic trocar was introduced, the abdomen inspected and no  evidence of trauma or adhesions encountered.  Suprapubic trocar was  introduced under direct visualization, initially a 5 mm trocar.  This was  subsequently converted to an 11/12 mm, later in the case.  The right lower  quadrant trocar was 5 mm in diameter.   Attention was directed to the pelvis, where normal anatomy was identified  with mobile uterus with supportive structures identifiable.   We began with removal of the uterus by first using the harmonic scalpel to  take down the right round ligament, and then take down the right utero-  ovarian ligament with serial bites, clamping and transecting with harmonic  scalpel.   Low power energy was used to improve hemostasis.   On the opposite side, a similar technique was used to sharply transect  through the round ligament and the utero-ovarian ligaments.  A bladder flap  was developed anteriorly, sharply peeling the bladder flap off of the lower  uterine segment.  We then proceeded to use the low-power setting to  coagulate the uterine vessels on the patient's uterus, trying to coagulate  the uterine vessels on either side, but without transecting, by using a 5-  second low-pressure coagulation on several sites on either side of the  uterus.  We then proceeded to complete the procedure vaginally.   After deflating the abdomen, with instruments removed, but laparoscopic  sleeves left in place, we then proceeded with the vaginal portion of the  case.  Repositioning the surgeon, placing the weighted speculum in the  vagina allowed grasping of the cervix.  Posterior colpotomy incision was  performed without difficulty and the anterior cervix elevated off from the  bladder.  Sharp scissors were used to enter the colpotomy course, then  curved Zeppelin clamps used to cross-clamp the uterosacral ligament on each  side with transection and suture  ligation.  Lower cardinal ligaments were  then clamped, cut and suture ligated.  The upper cardinal ligaments were  delayed until the anterior peritoneal opening was completed and then we  march-stepped the side of the uterus on either side, clamping, cutting and  suture ligating with 0 chromic suture after Zeppelin clamping, grasping and  Mayo scissor transection.  The specimen was able to be removed at this  point.  The pedicles were inspected as hemostatic.  The anterior peritoneum  was pulled toward the cul-de-sac and oversewn over the opening.  The  uterosacral ligaments were sutured to the cuff on each side, pulling the  uterosacral ligaments more tightly against the vaginal apex.  The remaining  portion of the cuff  was closed in a front-to-back fashion in running  fashion, resulting in good support of the cuff while elevating the posterior  aspects of the cuff.  Hemostasis was excellent.  No vaginal packing was  necessary.  The patient was allowed to go to the recovery room and  subsequently to the floor for postoperative care.   Sponge and needle counts correct.      Tilda Burrow, M.D.  Electronically Signed     JVF/MEDQ  D:  07/11/2006  T:  07/11/2006  Job:  [id number]

## 2011-06-20 ENCOUNTER — Emergency Department (HOSPITAL_COMMUNITY)
Admission: EM | Admit: 2011-06-20 | Discharge: 2011-06-20 | Disposition: A | Discharge status: Home / Self Care | Payer: Medicaid Other | Attending: Emergency Medicine | Admitting: Emergency Medicine

## 2011-06-20 ENCOUNTER — Emergency Department (HOSPITAL_COMMUNITY): Payer: Medicaid Other

## 2011-06-20 ENCOUNTER — Encounter: Payer: Self-pay | Admitting: *Deleted

## 2011-06-20 DIAGNOSIS — K529 Noninfective gastroenteritis and colitis, unspecified: Secondary | ICD-10-CM

## 2011-06-20 DIAGNOSIS — I10 Essential (primary) hypertension: Secondary | ICD-10-CM | POA: Insufficient documentation

## 2011-06-20 DIAGNOSIS — K5289 Other specified noninfective gastroenteritis and colitis: Secondary | ICD-10-CM | POA: Insufficient documentation

## 2011-06-20 DIAGNOSIS — R109 Unspecified abdominal pain: Secondary | ICD-10-CM | POA: Insufficient documentation

## 2011-06-20 HISTORY — DX: Diverticulitis of intestine, part unspecified, without perforation or abscess without bleeding: K57.92

## 2011-06-20 HISTORY — DX: Major depressive disorder, single episode, unspecified: F32.9

## 2011-06-20 HISTORY — DX: Depression, unspecified: F32.A

## 2011-06-20 HISTORY — DX: Essential (primary) hypertension: I10

## 2011-06-20 HISTORY — DX: Pure hypercholesterolemia, unspecified: E78.00

## 2011-06-20 LAB — COMPREHENSIVE METABOLIC PANEL
Alkaline Phosphatase: 69 U/L (ref 39–117)
BUN: 14 mg/dL (ref 6–23)
CO2: 25 mEq/L (ref 19–32)
GFR calc Af Amer: >60 mL/min (ref >60)
GFR calc non Af Amer: >60 mL/min (ref >60)
Glucose, Bld: 81 mg/dL (ref 70–99)
Potassium: 3.7 mEq/L (ref 3.5–5.1)
Total Bilirubin: 0.4 mg/dL (ref 0.3–1.2)
Total Protein: 8.5 g/dL — ABNORMAL HIGH (ref 6.0–8.3)

## 2011-06-20 LAB — CBC
Hemoglobin: 15.3 g/dL — ABNORMAL HIGH (ref 12.0–15.0)
MCH: 29.7 pg (ref 26.0–34.0)
MCV: 85.9 fL (ref 78.0–100.0)
RBC: 5.16 MIL/uL — ABNORMAL HIGH (ref 3.87–5.11)

## 2011-06-20 LAB — LACTIC ACID, PLASMA: Lactic Acid, Venous: 1.2 mmol/L (ref 0.5–2.2)

## 2011-06-20 LAB — URINALYSIS, ROUTINE W REFLEX MICROSCOPIC
Hgb urine dipstick: NEGATIVE
Specific Gravity, Urine: >1.03 — ABNORMAL HIGH (ref 1.005–1.030)
pH: 5.5 (ref 5.0–8.0)

## 2011-06-20 LAB — DIFFERENTIAL
Eosinophils Absolute: 0.1 10*3/uL (ref 0.0–0.7)
Lymphocytes Relative: 35 % (ref 12–46)
Lymphs Abs: 2.6 10*3/uL (ref 0.7–4.0)
Monocytes Relative: 7 % (ref 3–12)
Neutrophils Relative %: 57 % (ref 43–77)

## 2011-06-20 MED ORDER — HYDROMORPHONE HCL 1 MG/ML IJ SOLN
1.0000 mg | Freq: Once | INTRAMUSCULAR | Status: AC
  Administered 2011-06-20: 1 mg via INTRAVENOUS
  Filled 2011-06-20: qty 1

## 2011-06-20 MED ORDER — HYDROCODONE-ACETAMINOPHEN 5-500 MG PO TABS: 1.0000 | ORAL_TABLET | Freq: Four times a day (QID) | ORAL | Status: AC | PRN

## 2011-06-20 MED ORDER — IBUPROFEN 800 MG PO TABS: 800.0000 mg | ORAL_TABLET | Freq: Three times a day (TID) | ORAL | Status: AC

## 2011-06-20 MED ORDER — PROMETHAZINE HCL 25 MG RE SUPP: 25.0000 mg | Freq: Four times a day (QID) | RECTAL | Status: AC | PRN

## 2011-06-20 MED ORDER — IOHEXOL 300 MG/ML  SOLN
100.0000 mL | Freq: Once | INTRAMUSCULAR | Status: AC | PRN
  Administered 2011-06-20: 100 mL via INTRAVENOUS

## 2011-06-20 MED ORDER — MORPHINE SULFATE 4 MG/ML IJ SOLN
4.0000 mg | Freq: Once | INTRAMUSCULAR | Status: AC
  Administered 2011-06-20: 4 mg via INTRAVENOUS
  Filled 2011-06-20: qty 1

## 2011-06-20 MED ORDER — SODIUM CHLORIDE 0.9 % IV SOLN
Freq: Once | INTRAVENOUS | Status: AC
  Administered 2011-06-20: 19:00:00 via INTRAVENOUS

## 2011-06-20 MED ORDER — ONDANSETRON HCL 4 MG/2ML IJ SOLN
4.0000 mg | Freq: Once | INTRAMUSCULAR | Status: AC
  Administered 2011-06-20: 4 mg via INTRAVENOUS
  Filled 2011-06-20: qty 2

## 2011-06-20 MED ORDER — PROMETHAZINE HCL 25 MG PO TABS: 25.0000 mg | ORAL_TABLET | Freq: Four times a day (QID) | ORAL | Status: AC | PRN

## 2011-06-20 NOTE — ED Provider Notes (Signed)
History     Chief Complaint  Patient presents with  . Abdominal Pain   Patient is a 45 y.o. female presenting with abdominal pain. The history is provided by the patient and medical records.  Abdominal Pain The primary symptoms of the illness include abdominal pain, nausea and diarrhea ( watery X 4 in 24 hours). The primary symptoms of the illness do not include fever, fatigue, shortness of breath, vomiting, hematemesis, hematochezia, dysuria or vaginal discharge. Episode onset: 3 days ago. The onset of the illness was gradual. The problem has been gradually worsening.  Symptoms associated with the illness do not include back pain.    Past Medical History  Diagnosis Date  . Diverticulitis   . Depression   . Cancer   . Hypertension   . High cholesterol     Past Surgical History  Procedure Date  . Brain tumor excision   . Tubal ligation   . Abdominal hysterectomy     Family History  Problem Relation Age of Onset  . Hypertension Mother   . Hyperlipidemia Mother   . Hypertension Father   . Hyperlipidemia Father   . Heart failure Father     History  Substance Use Topics  . Smoking status: Never Smoker   . Smokeless tobacco: Not on file  . Alcohol Use: No    OB History    Grav Para Term Preterm Abortions TAB SAB Ect Mult Living   2 2              Review of Systems  Constitutional: Negative for fever and fatigue.  HENT: Negative for neck pain.   Eyes: Negative for visual disturbance.  Respiratory: Negative for shortness of breath.   Cardiovascular: Negative for chest pain and leg swelling.  Gastrointestinal: Positive for nausea, abdominal pain and diarrhea ( watery X 4 in 24 hours). Negative for vomiting, hematochezia and hematemesis.  Genitourinary: Negative for dysuria and vaginal discharge.  Musculoskeletal: Negative for back pain and joint swelling.  Neurological: Negative for dizziness and weakness.  Hematological: Negative for adenopathy.    Psychiatric/Behavioral: Negative for behavioral problems.    Physical Exam  BP 136/91  Pulse 113  Temp(Src) 98.7 F (37.1 C) (Oral)  Resp 20  Ht 5\' 6"  (1.676 m)  Wt 220 lb (99.791 kg)  BMI 35.51 kg/m2  SpO2 100%  Physical Exam  Nursing note and vitals reviewed. Constitutional: She appears well-developed and well-nourished. No distress.  HENT:  Head: Normocephalic and atraumatic.  Mouth/Throat: Oropharynx is clear and moist. No oropharyngeal exudate.  Eyes: Conjunctivae and EOM are normal. Pupils are equal, round, and reactive to light. Right eye exhibits no discharge. Left eye exhibits no discharge. No scleral icterus.  Neck: Normal range of motion. Neck supple. No JVD present. No thyromegaly present.  Cardiovascular: Normal rate, regular rhythm, normal heart sounds and intact distal pulses.  Exam reveals no gallop and no friction rub.   No murmur heard. Pulmonary/Chest: Effort normal and breath sounds normal. No respiratory distress. She has no wheezes. She has no rales.  Abdominal: Soft. Bowel sounds are normal. She exhibits no distension and no mass. There is no CVA tenderness.       RLQ(moderate) and epigastric (mild)ttp without guarding or peritoneal signs.  Musculoskeletal: Normal range of motion. She exhibits no edema and no tenderness.  Lymphadenopathy:    She has no cervical adenopathy.  Neurological: She is alert. Coordination normal.  Skin: Skin is warm and dry. No rash noted. She is not  diaphoretic. No erythema.  Psychiatric: She has a normal mood and affect. Her behavior is normal.    ED Course  Procedures  MDM Pt has ongoing RLQ pain which is constant X 3 days though fluctuates in intensity, has minimal LLQ pain - no fevers bu5t has nasuea and watery dairrhea, will r/o appy / tics, fluids, IV meds and reeval.    Pt reevaluated 8:21 PM and has improved pain, labs normal, CT without infection, likely gastroenteritis.  Will d/c home.  Taking PO  I have  reviewed the results with pT who is aware of findings and has expressed understanding and need for appropriate follow up.  All of pt questions pertaining to results have been answered.   Vida Roller, MD 06/20/11 2021

## 2011-06-20 NOTE — ED Notes (Signed)
Pt c/o lower abdominal pain, nausea and some diarrhea since Sunday. Pt states that it is gradually getting worse. Pt states that she has a history of diverticulitis and it feels like that.

## 2011-06-20 NOTE — ED Notes (Signed)
Pt drinking water for ct scan

## 2011-06-20 NOTE — ED Notes (Signed)
Returned from ct 

## 2011-06-20 NOTE — ED Notes (Signed)
  Pt transported to ct 

## 2011-06-20 NOTE — ED Notes (Signed)
Lower abd pain that started three days ago, worse on the right than on the left, diarrhea, nausea, hx of diverticulitis and pt states that this feels the same

## 2011-06-20 NOTE — ED Notes (Signed)
Pt ambulatory to restroom, states that the pain is unchanged since before the pain medication, Dr. Hyacinth Meeker notified,

## 2011-06-25 ENCOUNTER — Emergency Department (HOSPITAL_COMMUNITY)
Admission: EM | Admit: 2011-06-25 | Discharge: 2011-06-25 | Disposition: A | Discharge status: Home / Self Care | Payer: Medicaid Other | Attending: Emergency Medicine | Admitting: Emergency Medicine

## 2011-06-25 DIAGNOSIS — F3289 Other specified depressive episodes: Secondary | ICD-10-CM | POA: Insufficient documentation

## 2011-06-25 DIAGNOSIS — Z79899 Other long term (current) drug therapy: Secondary | ICD-10-CM | POA: Insufficient documentation

## 2011-06-25 DIAGNOSIS — R110 Nausea: Secondary | ICD-10-CM | POA: Insufficient documentation

## 2011-06-25 DIAGNOSIS — Z9889 Other specified postprocedural states: Secondary | ICD-10-CM | POA: Insufficient documentation

## 2011-06-25 DIAGNOSIS — E785 Hyperlipidemia, unspecified: Secondary | ICD-10-CM | POA: Insufficient documentation

## 2011-06-25 DIAGNOSIS — R1032 Left lower quadrant pain: Secondary | ICD-10-CM | POA: Insufficient documentation

## 2011-06-25 DIAGNOSIS — R197 Diarrhea, unspecified: Secondary | ICD-10-CM | POA: Insufficient documentation

## 2011-06-25 DIAGNOSIS — F329 Major depressive disorder, single episode, unspecified: Secondary | ICD-10-CM | POA: Insufficient documentation

## 2011-06-25 DIAGNOSIS — R10814 Left lower quadrant abdominal tenderness: Secondary | ICD-10-CM | POA: Insufficient documentation

## 2011-06-25 LAB — POCT I-STAT, CHEM 8
BUN: 14 mg/dL (ref 6–23)
Calcium, Ion: 1.02 mmol/L — ABNORMAL LOW (ref 1.12–1.32)
Chloride: 110 mEq/L (ref 96–112)
Creatinine, Ser: 0.8 mg/dL (ref 0.50–1.10)
Glucose, Bld: 81 mg/dL (ref 70–99)
HCT: 45 % (ref 36.0–46.0)
Hemoglobin: 15.3 g/dL — ABNORMAL HIGH (ref 12.0–15.0)
Potassium: 3.8 mEq/L (ref 3.5–5.1)
Sodium: 137 meq/L (ref 135–145)
TCO2: 21 mmol/L (ref 0–100)

## 2011-06-25 LAB — URINALYSIS, ROUTINE W REFLEX MICROSCOPIC
Bilirubin Urine: NEGATIVE
Ketones, ur: NEGATIVE mg/dL
Nitrite: NEGATIVE
Protein, ur: NEGATIVE mg/dL
Specific Gravity, Urine: 1.026 (ref 1.005–1.030)
Urobilinogen, UA: 0.2 mg/dL (ref 0.0–1.0)

## 2011-06-25 LAB — DIFFERENTIAL
Basophils Absolute: 0.1 10*3/uL (ref 0.0–0.1)
Basophils Relative: 2 % — ABNORMAL HIGH (ref 0–1)
Eosinophils Absolute: 0.1 10*3/uL (ref 0.0–0.7)
Eosinophils Relative: 1 % (ref 0–5)
Monocytes Absolute: 0.5 10*3/uL (ref 0.1–1.0)
Monocytes Relative: 6 % (ref 3–12)

## 2011-06-25 LAB — CBC
MCV: 84.5 fL (ref 78.0–100.0)
Platelets: 206 10*3/uL (ref 150–400)
RDW: 12.8 % (ref 11.5–15.5)
WBC: 7.2 10*3/uL (ref 4.0–10.5)

## 2011-10-07 ENCOUNTER — Emergency Department (HOSPITAL_COMMUNITY): Payer: Medicaid Other

## 2011-10-07 ENCOUNTER — Encounter (HOSPITAL_COMMUNITY): Payer: Self-pay

## 2011-10-07 ENCOUNTER — Emergency Department (HOSPITAL_COMMUNITY)
Admission: EM | Admit: 2011-10-07 | Discharge: 2011-10-07 | Disposition: A | Discharge status: Home / Self Care | Payer: Medicaid Other | Attending: Emergency Medicine | Admitting: Emergency Medicine

## 2011-10-07 DIAGNOSIS — F329 Major depressive disorder, single episode, unspecified: Secondary | ICD-10-CM | POA: Insufficient documentation

## 2011-10-07 DIAGNOSIS — E7800 Pure hypercholesterolemia, unspecified: Secondary | ICD-10-CM | POA: Insufficient documentation

## 2011-10-07 DIAGNOSIS — N83209 Unspecified ovarian cyst, unspecified side: Secondary | ICD-10-CM | POA: Insufficient documentation

## 2011-10-07 DIAGNOSIS — F3289 Other specified depressive episodes: Secondary | ICD-10-CM | POA: Insufficient documentation

## 2011-10-07 DIAGNOSIS — I10 Essential (primary) hypertension: Secondary | ICD-10-CM | POA: Insufficient documentation

## 2011-10-07 DIAGNOSIS — N83202 Unspecified ovarian cyst, left side: Secondary | ICD-10-CM

## 2011-10-07 LAB — DIFFERENTIAL
Basophils Absolute: 0.1 10*3/uL (ref 0.0–0.1)
Lymphocytes Relative: 28 % (ref 12–46)
Monocytes Absolute: 0.5 10*3/uL (ref 0.1–1.0)
Neutro Abs: 5.3 10*3/uL (ref 1.7–7.7)
Neutrophils Relative %: 65 % (ref 43–77)

## 2011-10-07 LAB — COMPREHENSIVE METABOLIC PANEL
ALT: 18 U/L (ref 0–35)
AST: 15 U/L (ref 0–37)
Alkaline Phosphatase: 74 U/L (ref 39–117)
CO2: 28 mEq/L (ref 19–32)
Chloride: 99 mEq/L (ref 96–112)
Creatinine, Ser: 0.83 mg/dL (ref 0.50–1.10)
GFR calc non Af Amer: 86 mL/min — ABNORMAL LOW (ref >90)
Potassium: 3.9 mEq/L (ref 3.5–5.1)
Sodium: 134 mEq/L — ABNORMAL LOW (ref 135–145)
Total Bilirubin: 0.6 mg/dL (ref 0.3–1.2)

## 2011-10-07 LAB — URINALYSIS, ROUTINE W REFLEX MICROSCOPIC
Bilirubin Urine: NEGATIVE
Glucose, UA: NEGATIVE mg/dL
Hgb urine dipstick: NEGATIVE
Protein, ur: NEGATIVE mg/dL
Urobilinogen, UA: 0.2 mg/dL (ref 0.0–1.0)

## 2011-10-07 LAB — CBC
HCT: 42.5 % (ref 36.0–46.0)
RDW: 13.4 % (ref 11.5–15.5)
WBC: 8.1 10*3/uL (ref 4.0–10.5)

## 2011-10-07 MED ORDER — IOHEXOL 300 MG/ML  SOLN
100.0000 mL | Freq: Once | INTRAMUSCULAR | Status: AC | PRN
  Administered 2011-10-07: 100 mL via INTRAVENOUS

## 2011-10-07 MED ORDER — MORPHINE SULFATE 4 MG/ML IJ SOLN
4.0000 mg | Freq: Once | INTRAMUSCULAR | Status: AC
  Administered 2011-10-07: 4 mg via INTRAVENOUS
  Filled 2011-10-07: qty 1

## 2011-10-07 MED ORDER — ONDANSETRON HCL 4 MG/2ML IJ SOLN
4.0000 mg | Freq: Once | INTRAMUSCULAR | Status: AC
Start: 2011-10-07 — End: 2011-10-07
  Administered 2011-10-07: 4 mg via INTRAVENOUS
  Filled 2011-10-07: qty 2

## 2011-10-07 MED ORDER — SODIUM CHLORIDE 0.9 % IV SOLN
Freq: Once | INTRAVENOUS | Status: AC
  Administered 2011-10-07: 18:00:00 via INTRAVENOUS

## 2011-10-07 MED ORDER — HYDROCODONE-ACETAMINOPHEN 5-500 MG PO TABS: 1.0000 | ORAL_TABLET | Freq: Four times a day (QID) | ORAL | Status: AC | PRN

## 2011-10-07 NOTE — ED Notes (Signed)
Pt left the er stating no needs 

## 2011-10-07 NOTE — ED Notes (Signed)
Pt presents with LLQ abdominal pain and N/D for 5 days. Pt states she has Hx of diverticulitis.

## 2011-10-07 NOTE — ED Provider Notes (Signed)
Scribed for [name] Sprout, MD, the patient was seen in room APA03/[facility]03 . This chart was scribed by Ellie Lunch.   CSN: [id number] Arrival date & time: 10/07/2011  5:09 PM   First MD Initiated Contact with Patient 10/07/11 1724      Chief Complaint  Patient presents with  . Abdominal Pain  . Nausea  . Diarrhea    (Consider location/radiation/quality/duration/timing/severity/associated sxs/prior treatment) HPI [name] is a 45 y.o. female who presents to the Emergency Department complaining of LLQ abdominal pain.  Pt with a history of diverticulitis reports developing LLQ pain ~ 4 days ago. Pain has become progressively worse, is described as a sharp pain,  and rated as severe. Pt c/o some associated nausea, diarrhea, and back pain. Pt denies fever, bloody stools, vomiting, dysuria.  Pain is not worsened by eating. Pt says she was admitted to the hospital that last time she had a flare up of diverticulitis.    Past Medical History  Diagnosis Date  . Diverticulitis   . Depression   . Cancer   . Hypertension   . High cholesterol     Past Surgical History  Procedure Date  . Brain tumor excision   . Tubal ligation   . Abdominal hysterectomy   cholecystomy   Family History  Problem Relation Age of Onset  . Hypertension Mother   . Hyperlipidemia Mother   . Hypertension Father   . Hyperlipidemia Father   . Heart failure Father     History  Substance Use Topics  . Smoking status: Never Smoker   . Smokeless tobacco: Not on file  . Alcohol Use: No    OB History    Grav Para Term Preterm Abortions TAB SAB Ect Mult Living   2 2              Review of Systems  Constitutional: Negative for fever.  Gastrointestinal: Positive for nausea, abdominal pain and diarrhea. Negative for vomiting and blood in stool.  Genitourinary: Negative for dysuria.  Musculoskeletal: Positive for back pain.  All other systems reviewed and are negative.   10 Systems reviewed and  are negative for acute change except as noted in the HPI.   Allergies  Dilaudid  Home Medications   Current Outpatient Rx  Name Route Sig Dispense Refill  . VENLAFAXINE HCL 150 MG PO CP24 Oral Take 150 mg by mouth daily.        BP 139/110  Pulse 119  Temp(Src) 98.2 F (36.8 C) (Oral)  Resp 16  Ht 5\' 6"  (1.676 m)  Wt 210 lb (95.255 kg)  BMI 33.89 kg/m2  SpO2 99%  Physical Exam  Nursing note and vitals reviewed. Constitutional: She is oriented to person, place, and time. She appears well-developed and well-nourished.  HENT:  Head: Normocephalic and atraumatic.  Eyes: Conjunctivae are normal. No scleral icterus.  Neck: Neck supple.  Cardiovascular: Normal rate, regular rhythm and normal heart sounds.   Pulmonary/Chest: Effort normal and breath sounds normal.  Abdominal: Soft. There is tenderness. There is rebound and guarding.       Noted tenderness of Right and left lower abdomen with guarding and rebound. Upper abdomen non tender. No flank tenderness.   Musculoskeletal: Normal range of motion. She exhibits no edema.  Neurological: She is alert and oriented to person, place, and time.  Skin: Skin is warm and dry.  Psychiatric: She has a normal mood and affect.    ED Course  Procedures (including critical care time)  OTHER DATA REVIEWED: Nursing notes, vital signs, and past medical records reviewed.   DIAGNOSTIC STUDIES: Oxygen Saturation is 999% on room air, normal by my interpretation.     Results for orders placed during the hospital encounter of 10/07/11  CBC      Component Value Range   WBC 8.1  4.0 - 10.5 (K/uL)   RBC 4.90  3.87 - 5.11 (MIL/uL)   Hemoglobin 14.6  12.0 - 15.0 (g/dL)   HCT 91.4  78.2 - 95.6 (%)   MCV 86.7  78.0 - 100.0 (fL)   MCH 29.8  26.0 - 34.0 (pg)   MCHC 34.4  30.0 - 36.0 (g/dL)   RDW 21.3  08.6 - 57.8 (%)   Platelets 233  150 - 400 (K/uL)  DIFFERENTIAL      Component Value Range   Neutrophils Relative 65  43 - 77 (%)   Neutro  Abs 5.3  1.7 - 7.7 (K/uL)   Lymphocytes Relative 28  12 - 46 (%)   Lymphs Abs 2.3  0.7 - 4.0 (K/uL)   Monocytes Relative 6  3 - 12 (%)   Monocytes Absolute 0.5  0.1 - 1.0 (K/uL)   Eosinophils Relative 1  0 - 5 (%)   Eosinophils Absolute 0.1  0.0 - 0.7 (K/uL)   Basophils Relative 1  0 - 1 (%)   Basophils Absolute 0.1  0.0 - 0.1 (K/uL)  COMPREHENSIVE METABOLIC PANEL      Component Value Range   Sodium 134 (*) 135 - 145 (mEq/L)   Potassium 3.9  3.5 - 5.1 (mEq/L)   Chloride 99  96 - 112 (mEq/L)   CO2 28  19 - 32 (mEq/L)   Glucose, Bld 90  70 - 99 (mg/dL)   BUN 10  6 - 23 (mg/dL)   Creatinine, Ser 4.69  0.50 - 1.10 (mg/dL)   Calcium 9.6  8.4 - 62.9 (mg/dL)   Total Protein 7.5  6.0 - 8.3 (g/dL)   Albumin 4.0  3.5 - 5.2 (g/dL)   AST 15  0 - 37 (U/L)   ALT 18  0 - 35 (U/L)   Alkaline Phosphatase 74  39 - 117 (U/L)   Total Bilirubin 0.6  0.3 - 1.2 (mg/dL)   GFR calc non Af Amer 86 (*) >90 (mL/min)   GFR calc Af Amer >90  >90 (mL/min)  URINALYSIS, ROUTINE W REFLEX MICROSCOPIC      Component Value Range   Color, Urine YELLOW  YELLOW    Appearance HAZY (*) CLEAR    Specific Gravity, Urine >1.030 (*) 1.005 - 1.030    pH 5.5  5.0 - 8.0    Glucose, UA NEGATIVE  NEGATIVE (mg/dL)   Hgb urine dipstick NEGATIVE  NEGATIVE    Bilirubin Urine NEGATIVE  NEGATIVE    Ketones, ur TRACE (*) NEGATIVE (mg/dL)   Protein, ur NEGATIVE  NEGATIVE (mg/dL)   Urobilinogen, UA 0.2  0.0 - 1.0 (mg/dL)   Nitrite NEGATIVE  NEGATIVE    Leukocytes, UA NEGATIVE  NEGATIVE   PREGNANCY, URINE      Component Value Range   Preg Test, Ur NEGATIVE     Ct Abdomen Pelvis W Contrast  10/07/2011  *RADIOLOGY REPORT*  Clinical Data: Bilateral lower abdominal pain.  CT ABDOMEN AND PELVIS WITH CONTRAST  Technique:  Multidetector CT imaging of the abdomen and pelvis was performed following the standard protocol during bolus administration of intravenous contrast.  Contrast: OMNIPAQUE IOHEXOL 300 MG/ML IV SOLN  Comparison:  06/20/2011.  Findings: Dependent atelectasis at the lung bases bilaterally. Probable fatty liver.  No focal mass lesions in the liver.  Cholecystectomy.  Spleen within normal limits.  Adrenal glands normal.  Normal renal enhancement.  Normal delayed excretion of contrast in the kidneys.  Tiny 1 cm lesion in the right hepatic lobe is unchanged.  Adrenal glands normal.  Stomach and small bowel appear within normal limits.  There is fatty infiltration of the ascending colonic wall, which can be associated with the previous episodes of colonic inflammatory change.  Mild thickening of the sigmoid colon probably represents combination of nondistension and diverticular hypertrophy.  Sigmoid diverticulitis has been observed on the prior exam in this region on 06/07/2010.  There is no recurrent diverticulitis.  38 mm left ovarian cystic lesion.  Physiologic appearance of the right ovary.  Hysterectomy.  Urinary bladder is within normal limits.  There is no mesenteric adenopathy.  No fat stranding. Pancreas appears within normal limits.  No aggressive osseous lesions are present.  IMPRESSION: 1.  38 mm left ovarian cyst. 2.  Mild colitis versus nondistension with chronic mild mural thickening of the the ascending colon, proximal transverse colon and sigmoid colon. 3.  Tiny right hepatic lobe cystic lesion is stable. 4.  Cholecystectomy and hysterectomy.  Original Report Authenticated By: Andreas Newport, M.D.    ED MEDICATIONS  Medications  ondansetron [facility]) injection 4 mg (4 mg Intravenous Given 10/07/11 1748)  morphine 4 MG/ML injection 4 mg (4 mg Intravenous Given 10/07/11 1748)  0.9 %  sodium chloride infusion (  Intravenous New Bag 10/07/11 1749)    No diagnosis found.    MDM   Pt with abd pain most concerning for diverticulitis however moderate tenderness and rebound in the RLQ as well.  Hx of diverticulitis in the past.  Denies vomitting but is nauseated.  No fever.  CBC, CMP, UA all wnl.  Based on  exam will get CT to r/o perforation.  7:42 PM Labs wnl.  CT showed no signs of diverticulitis but does have a large left ovarian cyst which explains her pain.  Will d/c home with pain control and gyn f/u.  I personally performed the services described in this documentation, which was scribed in my presence.  The recorded information has been reviewed and considered.       [name] Sprout, MD 10/08/11 0010

## 2012-03-08 ENCOUNTER — Emergency Department (HOSPITAL_COMMUNITY)
Admission: EM | Admit: 2012-03-08 | Discharge: 2012-03-08 | Disposition: A | Discharge status: Home / Self Care | Payer: Medicaid Other | Attending: Emergency Medicine | Admitting: Emergency Medicine

## 2012-03-08 ENCOUNTER — Emergency Department (HOSPITAL_COMMUNITY): Payer: Medicaid Other

## 2012-03-08 ENCOUNTER — Encounter (HOSPITAL_COMMUNITY): Payer: Self-pay | Admitting: *Deleted

## 2012-03-08 DIAGNOSIS — R630 Anorexia: Secondary | ICD-10-CM | POA: Insufficient documentation

## 2012-03-08 DIAGNOSIS — Z79899 Other long term (current) drug therapy: Secondary | ICD-10-CM | POA: Insufficient documentation

## 2012-03-08 DIAGNOSIS — F329 Major depressive disorder, single episode, unspecified: Secondary | ICD-10-CM | POA: Insufficient documentation

## 2012-03-08 DIAGNOSIS — R1013 Epigastric pain: Secondary | ICD-10-CM | POA: Insufficient documentation

## 2012-03-08 DIAGNOSIS — F3289 Other specified depressive episodes: Secondary | ICD-10-CM | POA: Insufficient documentation

## 2012-03-08 DIAGNOSIS — R110 Nausea: Secondary | ICD-10-CM | POA: Insufficient documentation

## 2012-03-08 DIAGNOSIS — R51 Headache: Secondary | ICD-10-CM | POA: Insufficient documentation

## 2012-03-08 DIAGNOSIS — I10 Essential (primary) hypertension: Secondary | ICD-10-CM | POA: Insufficient documentation

## 2012-03-08 DIAGNOSIS — E7800 Pure hypercholesterolemia, unspecified: Secondary | ICD-10-CM | POA: Insufficient documentation

## 2012-03-08 DIAGNOSIS — H538 Other visual disturbances: Secondary | ICD-10-CM | POA: Insufficient documentation

## 2012-03-08 HISTORY — DX: Benign neoplasm of brain, unspecified: D33.2

## 2012-03-08 LAB — DIFFERENTIAL
Basophils Absolute: 0.1 10*3/uL (ref 0.0–0.1)
Basophils Relative: 1 % (ref 0–1)
Eosinophils Relative: 1 % (ref 0–5)
Lymphocytes Relative: 29 % (ref 12–46)
Monocytes Absolute: 0.4 10*3/uL (ref 0.1–1.0)
Monocytes Relative: 6 % (ref 3–12)

## 2012-03-08 LAB — URINALYSIS, ROUTINE W REFLEX MICROSCOPIC
Hgb urine dipstick: NEGATIVE
Leukocytes, UA: NEGATIVE
Nitrite: NEGATIVE
Protein, ur: NEGATIVE mg/dL
Specific Gravity, Urine: 1.025 (ref 1.005–1.030)
Urobilinogen, UA: 0.2 mg/dL (ref 0.0–1.0)

## 2012-03-08 LAB — GLUCOSE, CAPILLARY: Glucose-Capillary: 90 mg/dL (ref 70–99)

## 2012-03-08 LAB — LIPASE, BLOOD: Lipase: 32 U/L (ref 11–59)

## 2012-03-08 LAB — COMPREHENSIVE METABOLIC PANEL
AST: 20 U/L (ref 0–37)
BUN: 9 mg/dL (ref 6–23)
CO2: 26 mEq/L (ref 19–32)
Calcium: 9.1 mg/dL (ref 8.4–10.5)
Chloride: 101 mEq/L (ref 96–112)
Creatinine, Ser: 0.64 mg/dL (ref 0.50–1.10)
GFR calc non Af Amer: >90 mL/min (ref >90)
Total Bilirubin: 0.4 mg/dL (ref 0.3–1.2)

## 2012-03-08 LAB — CBC
HCT: 37.9 % (ref 36.0–46.0)
Hemoglobin: 13.1 g/dL (ref 12.0–15.0)
MCHC: 34.6 g/dL (ref 30.0–36.0)
MCV: 86.1 fL (ref 78.0–100.0)
RDW: 13 % (ref 11.5–15.5)

## 2012-03-08 MED ORDER — ONDANSETRON HCL 4 MG/2ML IJ SOLN
4.0000 mg | Freq: Once | INTRAMUSCULAR | Status: AC
  Administered 2012-03-08: 4 mg via INTRAVENOUS

## 2012-03-08 MED ORDER — MORPHINE SULFATE 4 MG/ML IJ SOLN
INTRAMUSCULAR | Status: AC
  Administered 2012-03-08: 4 mg via INTRAVENOUS
  Filled 2012-03-08: qty 1

## 2012-03-08 MED ORDER — NAPROXEN 500 MG PO TABS: ORAL_TABLET | ORAL | Status: DC

## 2012-03-08 MED ORDER — HYDROMORPHONE HCL PF 1 MG/ML IJ SOLN
1.0000 mg | Freq: Once | INTRAMUSCULAR | Status: DC
  Filled 2012-03-08: qty 1

## 2012-03-08 MED ORDER — ONDANSETRON HCL 4 MG/2ML IJ SOLN
INTRAMUSCULAR | Status: AC
  Administered 2012-03-08: 4 mg via INTRAVENOUS
  Filled 2012-03-08: qty 2

## 2012-03-08 MED ORDER — MORPHINE SULFATE 4 MG/ML IJ SOLN
4.0000 mg | Freq: Once | INTRAMUSCULAR | Status: AC
  Administered 2012-03-08: 4 mg via INTRAVENOUS

## 2012-03-08 MED ORDER — ONDANSETRON HCL 4 MG/2ML IJ SOLN
4.0000 mg | Freq: Once | INTRAMUSCULAR | Status: AC
  Administered 2012-03-08: 4 mg via INTRAVENOUS
  Filled 2012-03-08: qty 2

## 2012-03-08 NOTE — ED Notes (Signed)
Pt DC to home with family.

## 2012-03-08 NOTE — ED Provider Notes (Signed)
History     CSN: [id number]  Arrival date & time 03/08/12  1525   First MD Initiated Contact with Patient 03/08/12 1605      Chief Complaint  Patient presents with  . Abdominal Pain    x 1 month  . Blurred Vision  . Headache  . Nausea  . Dizziness    (Consider location/radiation/quality/duration/timing/severity/associated sxs/prior treatment) Patient is a 46 y.o. female presenting with headaches. The history is provided by the patient (Patient complains of right-sided headache and some blurred vision. Mild nausea). No language interpreter was used.  Headache  This is a new problem. The current episode started 12 to 24 hours ago. The problem occurs constantly. The problem has not changed since onset.The pain is located in the right unilateral region. The quality of the pain is described as dull. The pain is at a severity of 7/10. The pain is moderate. The pain does not radiate. Associated symptoms include anorexia. She has tried nothing for the symptoms. The treatment provided no relief.    Past Medical History  Diagnosis Date  . Diverticulitis   . Depression   . Hypertension   . High cholesterol   . Benign tumor of brain     Past Surgical History  Procedure Date  . Brain tumor excision   . Tubal ligation   . Abdominal hysterectomy     Family History  Problem Relation Age of Onset  . Hypertension Mother   . Hyperlipidemia Mother   . Hypertension Father   . Hyperlipidemia Father   . Heart failure Father     History  Substance Use Topics  . Smoking status: Never Smoker   . Smokeless tobacco: Not on file  . Alcohol Use: No    OB History    Grav Para Term Preterm Abortions TAB SAB Ect Mult Living   2 2              Review of Systems  Constitutional: Negative for fatigue.  HENT: Negative for congestion, sinus pressure and ear discharge.   Eyes: Negative for discharge.  Respiratory: Negative for cough.   Cardiovascular: Negative for chest pain.    Gastrointestinal: Positive for abdominal pain and anorexia. Negative for diarrhea.  Genitourinary: Negative for frequency and hematuria.  Musculoskeletal: Negative for back pain.  Skin: Negative for rash.  Neurological: Positive for headaches. Negative for seizures.  Hematological: Negative.   Psychiatric/Behavioral: Negative for hallucinations.    Allergies  Dilaudid  Home Medications   Current Outpatient Rx  Name Route Sig Dispense Refill  . ALPRAZOLAM 2 MG PO TABS Oral Take 2 mg by mouth 4 (four) times daily. Patient states she takes 1 every 2 weeks if needed     . ESOMEPRAZOLE MAGNESIUM 20 MG PO CPDR Oral Take 20 mg by mouth daily before breakfast.    . HYDROCHLOROTHIAZIDE 25 MG PO TABS Oral Take 25 mg by mouth at bedtime.    . OXYCODONE-ACETAMINOPHEN 10-325 MG PO TABS Oral Take 1 tablet by mouth 4 (four) times daily as needed. pain    . VENLAFAXINE HCL ER 150 MG PO CP24 Oral Take 150 mg by mouth at bedtime.     Marland Kitchen NAPROXEN 500 MG PO TABS  Take one pill two times a day for pain 15 tablet 0    BP 145/100  Pulse 92  Temp(Src) 97.8 F (36.6 C) (Oral)  Resp 16  Ht 5\' 6"  (1.676 m)  Wt 225 lb (102.059 kg)  BMI 36.32 kg/m2  SpO2 100%  Physical Exam  Constitutional: She is oriented to person, place, and time. She appears well-developed.  HENT:  Head: Normocephalic and atraumatic.  Eyes: Conjunctivae and EOM are normal. No scleral icterus.  Neck: Neck supple. No thyromegaly present.  Cardiovascular: Normal rate and regular rhythm.  Exam reveals no gallop and no friction rub.   No murmur heard. Pulmonary/Chest: No stridor. She has no wheezes. She has no rales. She exhibits no tenderness.  Abdominal: She exhibits no distension. There is tenderness. There is no rebound.       Mild epigastric tendernous  Musculoskeletal: Normal range of motion. She exhibits no edema.  Lymphadenopathy:    She has no cervical adenopathy.  Neurological: She is oriented to person, place, and time.  Coordination normal.  Skin: No rash noted. No erythema.  Psychiatric: She has a normal mood and affect. Her behavior is normal.    ED Course  Procedures (including critical care time)   Labs Reviewed  CBC  DIFFERENTIAL  COMPREHENSIVE METABOLIC PANEL  URINALYSIS, ROUTINE W REFLEX MICROSCOPIC  GLUCOSE, CAPILLARY  LIPASE, BLOOD   Ct Head Wo Contrast  03/08/2012  *RADIOLOGY REPORT*  Clinical Data: Abdominal pain.  Blurry vision and headaches.  CT HEAD WITHOUT CONTRAST  Technique:  Contiguous axial images were obtained from the base of the skull through the vertex without contrast.  Comparison: 01/16/2007  Findings:  The brain has a normal appearance without evidence for hemorrhage, infarction, hydrocephalus, or mass lesion.  There is no extra axial fluid collection.  The skull and paranasal sinuses are normal.  IMPRESSION:  1.  No acute intracranial abnormalities.  Original Report Authenticated By: Rosealee Albee, M.D.   Dg Abd Acute W/chest  03/08/2012  *RADIOLOGY REPORT*  Clinical Data: Abdominal pain for past month.  Nausea and dizziness.  ACUTE ABDOMEN SERIES (ABDOMEN 2 VIEW & CHEST 1 VIEW)  Comparison: No priors.  Findings: Lung volumes are low.  No consolidative airspace disease. No pleural effusions.  Pulmonary vasculature and the cardiomediastinal silhouette are within normal limits.  No pneumoperitoneum.  Supine and upright views of the abdomen demonstrate gas and stool scattered throughout the colon extending to the distal rectum. There are a few nondilated loops of gas-filled small bowel noted in the central abdomen and in the left side of the abdomen, which is nonspecific.  No pathologic dilatation of small bowel.  Surgical clips projecting over the right upper quadrant of the abdomen, consistent with prior cholecystectomy.  IMPRESSION: 1.  Nonspecific, nonobstructive bowel gas pattern. 2.  No pneumoperitoneum. 3.  Status post cholecystectomy. 4.  Low lung volumes without radiographic  evidence of acute cardiopulmonary disease.  Original Report Authenticated By: Florencia Reasons, M.D.     1. Headache       MDM  Chronic abd pain and headache.  Ct head neg.  Chronic pain medicine use.  Recently ran out of percocet.  Headache could be do to narcotic withdrawl        Benny Lennert, MD 03/08/12 [phone]

## 2012-03-08 NOTE — Discharge Instructions (Signed)
Follow up with your md.  Next week for recheck

## 2012-03-08 NOTE — ED Notes (Addendum)
C/o abd pain x 1 month and states that she has seen PCP for it and was placed on Nexium; 2 days ago with HA, blurry vision and dizziness and nausea, frequent thirst and frequent urination

## 2012-03-09 ENCOUNTER — Inpatient Hospital Stay (HOSPITAL_COMMUNITY)
Admission: EM | Admit: 2012-03-09 | Discharge: 2012-03-12 | DRG: 392 | Disposition: A | Discharge status: Home / Self Care | Payer: Medicaid Other | Attending: Internal Medicine | Admitting: Internal Medicine

## 2012-03-09 ENCOUNTER — Encounter (HOSPITAL_COMMUNITY): Payer: Self-pay

## 2012-03-09 ENCOUNTER — Emergency Department (HOSPITAL_COMMUNITY): Payer: Medicaid Other

## 2012-03-09 DIAGNOSIS — F329 Major depressive disorder, single episode, unspecified: Secondary | ICD-10-CM | POA: Diagnosis present

## 2012-03-09 DIAGNOSIS — E7800 Pure hypercholesterolemia, unspecified: Secondary | ICD-10-CM | POA: Diagnosis present

## 2012-03-09 DIAGNOSIS — K5732 Diverticulitis of large intestine without perforation or abscess without bleeding: Principal | ICD-10-CM | POA: Diagnosis present

## 2012-03-09 DIAGNOSIS — I10 Essential (primary) hypertension: Secondary | ICD-10-CM | POA: Insufficient documentation

## 2012-03-09 DIAGNOSIS — Z79899 Other long term (current) drug therapy: Secondary | ICD-10-CM

## 2012-03-09 DIAGNOSIS — R112 Nausea with vomiting, unspecified: Secondary | ICD-10-CM | POA: Diagnosis present

## 2012-03-09 DIAGNOSIS — K5792 Diverticulitis of intestine, part unspecified, without perforation or abscess without bleeding: Secondary | ICD-10-CM

## 2012-03-09 DIAGNOSIS — F3289 Other specified depressive episodes: Secondary | ICD-10-CM | POA: Diagnosis present

## 2012-03-09 DIAGNOSIS — R000 Tachycardia, unspecified: Secondary | ICD-10-CM | POA: Diagnosis present

## 2012-03-09 DIAGNOSIS — Z86011 Personal history of benign neoplasm of the brain: Secondary | ICD-10-CM

## 2012-03-09 LAB — DIFFERENTIAL
Basophils Absolute: 0.1 10*3/uL (ref 0.0–0.1)
Basophils Relative: 1 % (ref 0–1)
Lymphocytes Relative: 18 % (ref 12–46)
Neutro Abs: 9.2 10*3/uL — ABNORMAL HIGH (ref 1.7–7.7)
Neutrophils Relative %: 75 % (ref 43–77)

## 2012-03-09 LAB — CBC
Hemoglobin: 13.2 g/dL (ref 12.0–15.0)
MCHC: 34.6 g/dL (ref 30.0–36.0)
RDW: 12.9 % (ref 11.5–15.5)
WBC: 12.3 10*3/uL — ABNORMAL HIGH (ref 4.0–10.5)

## 2012-03-09 MED ORDER — ONDANSETRON HCL 4 MG/2ML IJ SOLN
4.0000 mg | INTRAMUSCULAR | Status: AC | PRN
  Administered 2012-03-10 (×2): 4 mg via INTRAVENOUS
  Filled 2012-03-09 (×2): qty 2

## 2012-03-09 MED ORDER — FENTANYL CITRATE 0.05 MG/ML IJ SOLN
50.0000 ug | INTRAMUSCULAR | Status: DC | PRN
  Administered 2012-03-10: 50 ug via INTRAVENOUS
  Filled 2012-03-09: qty 2

## 2012-03-09 MED ORDER — SODIUM CHLORIDE 0.9 % IV SOLN
INTRAVENOUS | Status: DC
  Administered 2012-03-10: via INTRAVENOUS

## 2012-03-09 NOTE — ED Notes (Signed)
Seen here yesterday for epigastric pain; hurting in lower left abdomen today. Vomited twice today per pt.

## 2012-03-09 NOTE — ED Notes (Signed)
IV established prior to arrival by EMS.  Zofran 4mg  IV given en-route to ER by EMS.

## 2012-03-09 NOTE — ED Provider Notes (Signed)
History   This chart was scribed for [name] Anger, DO scribed by [employer]. The patient was seen in room [facility]14/[facility]14 seen at 23:21.     CSN: [id number]  Arrival date & time 03/09/12  2254   First MD Initiated Contact with Patient 03/09/12 2305      Chief Complaint  Patient presents with  . Abdominal Pain    HPI [name] is a 46 y.o. female who presents to the Emergency Department complaining of gradual onset and persistence of constant LLQ abdominal "pain" since earlier today.  Has been assoicated with several intermittent episodes of nausea/vomiting for the past 3-4 days. Endorses she has been unable to tol PO well due to N/V.  Pt was eval in ED yesterday for mid-epigastric/LUQ pain; states that pain is now located in her LLQ.  Denies diarrhea, no black or bloody stools, no fevers.  States she has hx of of diverticulitis, dx approx 2 years ago and this pain feels similar.   CP: no local  Past Medical History  Diagnosis Date  . Diverticulitis   . Depression   . Hypertension   . High cholesterol   . Benign tumor of brain     Past Surgical History  Procedure Date  . Brain tumor excision   . Tubal ligation   . Abdominal hysterectomy     Family History  Problem Relation Age of Onset  . Hypertension Mother   . Hyperlipidemia Mother   . Hypertension Father   . Hyperlipidemia Father   . Heart failure Father     History  Substance Use Topics  . Smoking status: Never Smoker   . Smokeless tobacco: Not on file  . Alcohol Use: No    OB History    Grav Para Term Preterm Abortions TAB SAB Ect Mult Living   2 2              Review of Systems ROS: Statement: All systems negative except as marked or noted in the HPI; Constitutional: Negative for fever and chills. ; ; Eyes: Negative for eye pain, redness and discharge. ; ; ENMT: Negative for ear pain, hoarseness, nasal congestion, sinus pressure and sore throat. ; ; Cardiovascular: Negative for chest pain,  palpitations, diaphoresis, dyspnea and peripheral edema. ; ; Respiratory: Negative for cough, wheezing and stridor. ; ; Gastrointestinal: +N/V, abd pain. Negative for diarrhea, blood in stool, hematemesis, jaundice and rectal bleeding. . ; ; Genitourinary: Negative for dysuria, flank pain and hematuria. ; ; Musculoskeletal: Negative for back pain and neck pain. Negative for swelling and trauma.; ; Skin: Negative for pruritus, rash, abrasions, blisters, bruising and skin lesion.; ; Neuro: Negative for headache, lightheadedness and neck stiffness. Negative for weakness, altered level of consciousness , altered mental status, extremity weakness, paresthesias, involuntary movement, seizure and syncope.       Allergies  Dilaudid  Home Medications   Current Outpatient Rx  Name Route Sig Dispense Refill  . ALPRAZOLAM 2 MG PO TABS Oral Take 2 mg by mouth 4 (four) times daily. Patient states she takes 1 every 2 weeks if needed     . ESOMEPRAZOLE MAGNESIUM 20 MG PO CPDR Oral Take 20 mg by mouth daily before breakfast.    . HYDROCHLOROTHIAZIDE 25 MG PO TABS Oral Take 25 mg by mouth at bedtime.    Marland Kitchen NAPROXEN 500 MG PO TABS  Take one pill two times a day for pain 15 tablet 0  . VENLAFAXINE HCL ER 150 MG PO  CP24 Oral Take 150 mg by mouth at bedtime.     . OXYCODONE-ACETAMINOPHEN 10-325 MG PO TABS Oral Take 1 tablet by mouth 4 (four) times daily as needed. pain      BP 137/87  Pulse 110  Temp(Src) 99.5 F (37.5 C) (Oral)  Resp 20  Ht 5\' 6"  (1.676 m)  Wt 225 lb (102.059 kg)  BMI 36.32 kg/m2  SpO2 96%  Physical Exam 2325: Physical examination:  Nursing notes reviewed; Vital signs and O2 SAT reviewed;  Constitutional: Well developed, Well nourished, In no acute distress; Head:  Normocephalic, atraumatic; Eyes: EOMI, PERRL, No scleral icterus; ENMT: Mouth and pharynx normal, Mucous membranes dry; Neck: Supple, Full range of motion, No lymphadenopathy; Cardiovascular: Regular rate and rhythm, No murmur,  rub, or gallop; Respiratory: Breath sounds clear & equal bilaterally, No rales, rhonchi, wheezes, or rub, Normal respiratory effort/excursion; Chest: Nontender, Movement normal; Abdomen: Soft, +TTP LUQ and LLQ, no rebound or guarding, Nondistended, Normal bowel sounds; Extremities: Pulses normal, No tenderness, No edema, No calf edema or asymmetry.; Neuro: AA&Ox3, Major CN grossly intact.  No gross focal motor or sensory deficits in extremities.; Skin: Color normal, Warm, Dry   ED Course  Procedures   MDM  MDM Reviewed: nursing note, vitals and previous chart Reviewed previous: labs and x-ray Interpretation: labs and CT scan   Results for orders placed during the hospital encounter of 03/09/12  CBC      Component Value Range   WBC 12.3 (*) 4.0 - 10.5 (K/uL)   RBC 4.40  3.87 - 5.11 (MIL/uL)   Hemoglobin 13.2  12.0 - 15.0 (g/dL)   HCT 16.1  09.6 - 04.5 (%)   MCV 86.8  78.0 - 100.0 (fL)   MCH 30.0  26.0 - 34.0 (pg)   MCHC 34.6  30.0 - 36.0 (g/dL)   RDW 40.9  81.1 - 91.4 (%)   Platelets 207  150 - 400 (K/uL)  DIFFERENTIAL      Component Value Range   Neutrophils Relative 75  43 - 77 (%)   Neutro Abs 9.2 (*) 1.7 - 7.7 (K/uL)   Lymphocytes Relative 18  12 - 46 (%)   Lymphs Abs 2.2  0.7 - 4.0 (K/uL)   Monocytes Relative 6  3 - 12 (%)   Monocytes Absolute 0.7  0.1 - 1.0 (K/uL)   Eosinophils Relative 1  0 - 5 (%)   Eosinophils Absolute 0.1  0.0 - 0.7 (K/uL)   Basophils Relative 1  0 - 1 (%)   Basophils Absolute 0.1  0.0 - 0.1 (K/uL)  COMPREHENSIVE METABOLIC PANEL      Component Value Range   Sodium 136  135 - 145 (mEq/L)   Potassium 3.5  3.5 - 5.1 (mEq/L)   Chloride 97  96 - 112 (mEq/L)   CO2 29  19 - 32 (mEq/L)   Glucose, Bld 105 (*) 70 - 99 (mg/dL)   BUN 10  6 - 23 (mg/dL)   Creatinine, Ser 7.82  0.50 - 1.10 (mg/dL)   Calcium 9.5  8.4 - 95.6 (mg/dL)   Total Protein 7.0  6.0 - 8.3 (g/dL)   Albumin 3.7  3.5 - 5.2 (g/dL)   AST 15  0 - 37 (U/L)   ALT 24  0 - 35 (U/L)    Alkaline Phosphatase 67  39 - 117 (U/L)   Total Bilirubin 0.5  0.3 - 1.2 (mg/dL)   GFR calc non Af Amer >90  >90 (mL/min)   GFR  calc Af Amer >90  >90 (mL/min)  LIPASE, BLOOD      Component Value Range   Lipase 28  11 - 59 (U/L)  URINALYSIS, ROUTINE W REFLEX MICROSCOPIC      Component Value Range   Color, Urine YELLOW  YELLOW    APPearance CLEAR  CLEAR    Specific Gravity, Urine 1.020  1.005 - 1.030    pH 6.0  5.0 - 8.0    Glucose, UA NEGATIVE  NEGATIVE (mg/dL)   Hgb urine dipstick NEGATIVE  NEGATIVE    Bilirubin Urine NEGATIVE  NEGATIVE    Ketones, ur NEGATIVE  NEGATIVE (mg/dL)   Protein, ur NEGATIVE  NEGATIVE (mg/dL)   Urobilinogen, UA 0.2  0.0 - 1.0 (mg/dL)   Nitrite NEGATIVE  NEGATIVE    Leukocytes, UA NEGATIVE  NEGATIVE     Ct Abdomen Pelvis W Contrast 03/10/2012  *RADIOLOGY REPORT*  Clinical Data: Left lower quadrant abdominal pain for 2 days.  CT ABDOMEN AND PELVIS WITH CONTRAST  Technique:  Multidetector CT imaging of the abdomen and pelvis was performed following the standard protocol during bolus administration of intravenous contrast.  Contrast: OMNIPAQUE IOHEXOL 300 MG/ML IJ SOLN  Comparison: 10/07/2011  Findings: Visualized lung bases are clear.  Diffuse low attenuation change throughout the liver consistent with fatty infiltration.  The small low attenuation lesion in the medial segment left lobe of liver measuring about 8 mm diameter, likely representing cyst or hemangioma.  Surgical absence of the gallbladder.  Small accessory spleen.  The pancreas, spleen, adrenal glands, kidneys, abdominal aorta, and retroperitoneal lymph nodes are unremarkable.  Stomach and small bowel are not distended. No wall thickening.  Stool filled colon without distension or wall thickening apparent.  No free air or free fluid in the abdomen. Mesenteric vessels appear patent.  Pelvis:  Wall thickening and inflammatory changes around the sigmoid colon with scattered diverticula.  Changes are  consistent with diverticulitis.  No pericolonic abscess.  The uterus is surgically absent.  Ovaries are not enlarged.  Normal appendix. Bladder wall is not thickened.  No significant pelvic lymphadenopathy.  Normal alignment of the lumbar vertebrae.  IMPRESSION: Pericolonic inflammatory change in the sigmoid region consistent with diverticulitis.  No abscess.  Original Report Authenticated By: Marlon Pel, M.D.   Dg Abd Acute W/chest 03/08/2012  *RADIOLOGY REPORT*  Clinical Data: Abdominal pain for past month.  Nausea and dizziness.  ACUTE ABDOMEN SERIES (ABDOMEN 2 VIEW & CHEST 1 VIEW)  Comparison: No priors.  Findings: Lung volumes are low.  No consolidative airspace disease. No pleural effusions.  Pulmonary vasculature and the cardiomediastinal silhouette are within normal limits.  No pneumoperitoneum.  Supine and upright views of the abdomen demonstrate gas and stool scattered throughout the colon extending to the distal rectum. There are a few nondilated loops of gas-filled small bowel noted in the central abdomen and in the left side of the abdomen, which is nonspecific.  No pathologic dilatation of small bowel.  Surgical clips projecting over the right upper quadrant of the abdomen, consistent with prior cholecystectomy.  IMPRESSION: 1.  Nonspecific, nonobstructive bowel gas pattern. 2.  No pneumoperitoneum. 3.  Status post cholecystectomy. 4.  Low lung volumes without radiographic evidence of acute cardiopulmonary disease.  Original Report Authenticated By: Florencia Reasons, M.D.      2:14 AM:  No N/V in ED after IV zofran x2.  Continues mildly tachycardic, but no fever in ED.  Will dose IVF, IV cipro and flagyl for diverticulitis.  Dx testing d/w pt.  Questions answered.  Verb understanding, agreeable to admit.  T/C to [facility] Dr. Onalee Hua, case discussed, including:  HPI, pertinent PM/SHx, VS/PE, dx testing, ED course and treatment:  Agreeable to admit, requests to write temporary orders, obtain  medical bed to team 2.    I personally performed the services described in this documentation, which was scribed in my presence. The recorded information has been reviewed and considered. Alonni Heimsoth Allison Quarry, DO 03/10/12 1715

## 2012-03-10 ENCOUNTER — Encounter (HOSPITAL_COMMUNITY): Payer: Self-pay | Admitting: Emergency Medicine

## 2012-03-10 DIAGNOSIS — I10 Essential (primary) hypertension: Secondary | ICD-10-CM | POA: Insufficient documentation

## 2012-03-10 DIAGNOSIS — R112 Nausea with vomiting, unspecified: Secondary | ICD-10-CM | POA: Diagnosis present

## 2012-03-10 DIAGNOSIS — K5792 Diverticulitis of intestine, part unspecified, without perforation or abscess without bleeding: Secondary | ICD-10-CM | POA: Diagnosis present

## 2012-03-10 LAB — CBC
HCT: 36.8 % (ref 36.0–46.0)
Hemoglobin: 12.7 g/dL (ref 12.0–15.0)
MCV: 87 fL (ref 78.0–100.0)
RDW: 13.2 % (ref 11.5–15.5)
WBC: 11.4 10*3/uL — ABNORMAL HIGH (ref 4.0–10.5)

## 2012-03-10 LAB — COMPREHENSIVE METABOLIC PANEL
ALT: 24 U/L (ref 0–35)
AST: 15 U/L (ref 0–37)
Albumin: 3.7 g/dL (ref 3.5–5.2)
Alkaline Phosphatase: 67 U/L (ref 39–117)
Chloride: 97 mEq/L (ref 96–112)
Potassium: 3.5 mEq/L (ref 3.5–5.1)
Total Bilirubin: 0.5 mg/dL (ref 0.3–1.2)

## 2012-03-10 LAB — URINALYSIS, ROUTINE W REFLEX MICROSCOPIC
Bilirubin Urine: NEGATIVE
Glucose, UA: NEGATIVE mg/dL
Hgb urine dipstick: NEGATIVE
Ketones, ur: NEGATIVE mg/dL
Leukocytes, UA: NEGATIVE
pH: 6 (ref 5.0–8.0)

## 2012-03-10 LAB — BASIC METABOLIC PANEL
CO2: 28 mEq/L (ref 19–32)
Chloride: 95 mEq/L — ABNORMAL LOW (ref 96–112)
Creatinine, Ser: 0.78 mg/dL (ref 0.50–1.10)
GFR calc Af Amer: >90 mL/min (ref >90)
Potassium: 3.4 mEq/L — ABNORMAL LOW (ref 3.5–5.1)

## 2012-03-10 MED ORDER — METRONIDAZOLE IN NACL 5-0.79 MG/ML-% IV SOLN
500.0000 mg | Freq: Once | INTRAVENOUS | Status: AC
  Administered 2012-03-10: 500 mg via INTRAVENOUS
  Filled 2012-03-10: qty 100

## 2012-03-10 MED ORDER — ONDANSETRON HCL 4 MG/2ML IJ SOLN: 4.0000 mg | Freq: Three times a day (TID) | INTRAMUSCULAR | Status: DC | PRN

## 2012-03-10 MED ORDER — SODIUM CHLORIDE 0.9 % IV BOLUS (SEPSIS)
500.0000 mL | Freq: Once | INTRAVENOUS | Status: DC
  Administered 2012-03-10: 500 mL via INTRAVENOUS

## 2012-03-10 MED ORDER — CIPROFLOXACIN HCL 250 MG PO TABS
250.0000 mg | ORAL_TABLET | Freq: Two times a day (BID) | ORAL | Status: DC
  Administered 2012-03-10: 250 mg via ORAL
  Filled 2012-03-10: qty 1

## 2012-03-10 MED ORDER — VENLAFAXINE HCL ER 75 MG PO CP24
150.0000 mg | ORAL_CAPSULE | Freq: Every day | ORAL | Status: DC
  Administered 2012-03-11: 150 mg via ORAL
  Filled 2012-03-10 (×2): qty 2

## 2012-03-10 MED ORDER — OXYCODONE-ACETAMINOPHEN 5-325 MG PO TABS
2.0000 | ORAL_TABLET | ORAL | Status: DC | PRN
  Administered 2012-03-10 (×2): 2 via ORAL
  Filled 2012-03-10 (×2): qty 2

## 2012-03-10 MED ORDER — CIPROFLOXACIN IN D5W 400 MG/200ML IV SOLN
400.0000 mg | Freq: Once | INTRAVENOUS | Status: AC
  Administered 2012-03-10: 400 mg via INTRAVENOUS
  Filled 2012-03-10: qty 200

## 2012-03-10 MED ORDER — SODIUM CHLORIDE 0.9 % IV SOLN: INTRAVENOUS | Status: AC

## 2012-03-10 MED ORDER — SODIUM CHLORIDE 0.9 % IJ SOLN
INTRAMUSCULAR | Status: AC
  Filled 2012-03-10: qty 3

## 2012-03-10 MED ORDER — MORPHINE SULFATE 2 MG/ML IJ SOLN
1.0000 mg | INTRAMUSCULAR | Status: DC | PRN
  Administered 2012-03-10 – 2012-03-11 (×3): 1 mg via INTRAVENOUS
  Filled 2012-03-10 (×3): qty 1

## 2012-03-10 MED ORDER — IOHEXOL 300 MG/ML  SOLN
100.0000 mL | Freq: Once | INTRAMUSCULAR | Status: AC | PRN
  Administered 2012-03-10: 100 mL via INTRAVENOUS

## 2012-03-10 MED ORDER — CIPROFLOXACIN IN D5W 400 MG/200ML IV SOLN
400.0000 mg | Freq: Two times a day (BID) | INTRAVENOUS | Status: DC
  Administered 2012-03-10 – 2012-03-12 (×4): 400 mg via INTRAVENOUS
  Filled 2012-03-10 (×6): qty 200

## 2012-03-10 MED ORDER — ONDANSETRON HCL 4 MG/2ML IJ SOLN
4.0000 mg | Freq: Four times a day (QID) | INTRAMUSCULAR | Status: DC | PRN
  Administered 2012-03-11 – 2012-03-12 (×4): 4 mg via INTRAVENOUS
  Filled 2012-03-10 (×7): qty 2

## 2012-03-10 MED ORDER — METRONIDAZOLE IN NACL 5-0.79 MG/ML-% IV SOLN
500.0000 mg | Freq: Three times a day (TID) | INTRAVENOUS | Status: DC
  Administered 2012-03-10 – 2012-03-12 (×6): 500 mg via INTRAVENOUS
  Filled 2012-03-10 (×7): qty 100

## 2012-03-10 MED ORDER — HYDROCHLOROTHIAZIDE 25 MG PO TABS
25.0000 mg | ORAL_TABLET | Freq: Every day | ORAL | Status: DC
  Administered 2012-03-11: 25 mg via ORAL
  Filled 2012-03-10 (×2): qty 1

## 2012-03-10 MED ORDER — METRONIDAZOLE 500 MG PO TABS
500.0000 mg | ORAL_TABLET | Freq: Three times a day (TID) | ORAL | Status: DC
  Administered 2012-03-10: 500 mg via ORAL
  Filled 2012-03-10: qty 1

## 2012-03-10 MED ORDER — OXYCODONE-ACETAMINOPHEN 10-325 MG PO TABS: 1.0000 | ORAL_TABLET | ORAL | Status: DC | PRN

## 2012-03-10 MED ORDER — ONDANSETRON HCL 4 MG PO TABS: 4.0000 mg | ORAL_TABLET | Freq: Four times a day (QID) | ORAL | Status: DC | PRN

## 2012-03-10 MED ORDER — SODIUM CHLORIDE 0.9 % IV SOLN
INTRAVENOUS | Status: DC
  Administered 2012-03-10: 10:00:00 via INTRAVENOUS

## 2012-03-10 NOTE — Progress Notes (Signed)
Patient admitted earlier this morning by Dr. Onalee Hua for acute diverticulitis  Patient seen and examined, database reviewed  She still feels very nauseous and has only had ice chips  Will continue bowel rest for now  Continue IV abx   Advance diet as tolerated to low residue.  Once tolerating po, will plan on discharge home.

## 2012-03-10 NOTE — H&P (Signed)
Chief Complaint:  Nausea vomiting and diarrhea with abdominal pain  HPI: 46 year old female who has had multiple visits to the emergency department over the last week who comes in again because of the same nausea vomiting diarrhea and left lower quadrant abdominal pain. She denies any fevers but feels like her pain is progressively gotten worse. CAT scan of her abdomen and pelvis today was done which shows a mild case of acute diverticulitis. She has not been on any antibiotics. There is no blood in her vomit or diarrhea.  Review of Systems:  Otherwise negative  Past Medical History: Past Medical History  Diagnosis Date  . Diverticulitis   . Depression   . Hypertension   . High cholesterol   . Benign tumor of brain    Past Surgical History  Procedure Date  . Brain tumor excision   . Tubal ligation   . Abdominal hysterectomy   . Cholecystectomy   . Endometrial ablation     Medications: Prior to Admission medications   Medication Sig Start Date End Date Taking? Authorizing Provider  alprazolam Prudy Feeler) 2 MG tablet Take 2 mg by mouth 4 (four) times daily. Patient states she takes 1 every 2 weeks if needed    Yes Historical Provider, MD  esomeprazole (NEXIUM) 20 MG capsule Take 20 mg by mouth daily before breakfast.   Yes Historical Provider, MD  hydrochlorothiazide (HYDRODIURIL) 25 MG tablet Take 25 mg by mouth at bedtime.   Yes Historical Provider, MD  naproxen (NAPROSYN) 500 MG tablet Take one pill two times a day for pain 03/08/12  Yes Benny Lennert, MD  venlafaxine (EFFEXOR-XR) 150 MG 24 hr capsule Take 150 mg by mouth at bedtime.    Yes Historical Provider, MD  oxyCODONE-acetaminophen (PERCOCET) 10-325 MG per tablet Take 1 tablet by mouth 4 (four) times daily as needed. pain    Historical Provider, MD    Allergies:   Allergies  Allergen Reactions  . Dilaudid (Hydromorphone Hcl) Anxiety    Social History:  reports that she has never smoked. She does not have any  smokeless tobacco history on file. She reports that she does not drink alcohol or use illicit drugs.  Family History: Family History  Problem Relation Age of Onset  . Hypertension Mother   . Hyperlipidemia Mother   . Hypertension Father   . Hyperlipidemia Father   . Heart failure Father     Physical Exam: Filed Vitals:   03/09/12 2248 03/10/12 0152  BP: 137/87 125/81  Pulse: 110 109  Temp: 99.5 F (37.5 C) 97.8 F (36.6 C)  TempSrc: Oral Oral  Resp: 20 20  Height: 5\' 6"  (1.676 m)   Weight: 102.059 kg (225 lb)   SpO2: 96% 100%   BP 128/83  Pulse 110  Temp(Src) 99.1 F (37.3 C) (Oral)  Resp 24  Ht 5\' 6"  (1.676 m)  Wt 102.059 kg (225 lb)  BMI 36.32 kg/m2  SpO2 95% General appearance: alert, cooperative and no distress Lungs: clear to auscultation bilaterally Heart: regular rate and rhythm, S1, S2 normal, no murmur, click, rub or gallop Abdomen: soft, non-tender; bowel sounds normal; no masses,  no organomegaly Extremities: extremities normal, atraumatic, no cyanosis or edema Pulses: 2+ and symmetric Skin: Skin color, texture, turgor normal. No rashes or lesions Neurologic: Grossly normal    Labs on Admission:   [facility] 03/09/12 2336 03/08/12 1705  NA 136 138  K 3.5 3.7  CL 97 101  CO2 29 26  GLUCOSE 105* 93  BUN 10 9  CREATININE 0.79 0.64  CALCIUM 9.5 9.1  MG -- --  PHOS -- --    Basename 03/09/12 2336 03/08/12 1705  AST 15 20  ALT 24 27  ALKPHOS 67 59  BILITOT 0.5 0.4  PROT 7.0 6.9  ALBUMIN 3.7 3.7    Basename 03/09/12 2336 03/08/12 1705  LIPASE 28 32  AMYLASE -- --    Basename 03/09/12 2336 03/08/12 1705  WBC 12.3* 6.5  NEUTROABS 9.2* 4.1  HGB 13.2 13.1  HCT 38.2 37.9  MCV 86.8 86.1  PLT 207 214    Radiological Exams on Admission: Ct Head Wo Contrast  03/08/2012  *RADIOLOGY REPORT*  Clinical Data: Abdominal pain.  Blurry vision and headaches.  CT HEAD WITHOUT CONTRAST  Technique:  Contiguous axial images were obtained from the base  of the skull through the vertex without contrast.  Comparison: 01/16/2007  Findings:  The brain has a normal appearance without evidence for hemorrhage, infarction, hydrocephalus, or mass lesion.  There is no extra axial fluid collection.  The skull and paranasal sinuses are normal.  IMPRESSION:  1.  No acute intracranial abnormalities.  Original Report Authenticated By: Rosealee Albee, M.D.   Ct Abdomen Pelvis W Contrast  03/10/2012  *RADIOLOGY REPORT*  Clinical Data: Left lower quadrant abdominal pain for 2 days.  CT ABDOMEN AND PELVIS WITH CONTRAST  Technique:  Multidetector CT imaging of the abdomen and pelvis was performed following the standard protocol during bolus administration of intravenous contrast.  Contrast: OMNIPAQUE IOHEXOL 300 MG/ML IJ SOLN  Comparison: 10/07/2011  Findings: Visualized lung bases are clear.  Diffuse low attenuation change throughout the liver consistent with fatty infiltration.  The small low attenuation lesion in the medial segment left lobe of liver measuring about 8 mm diameter, likely representing cyst or hemangioma.  Surgical absence of the gallbladder.  Small accessory spleen.  The pancreas, spleen, adrenal glands, kidneys, abdominal aorta, and retroperitoneal lymph nodes are unremarkable.  Stomach and small bowel are not distended. No wall thickening.  Stool filled colon without distension or wall thickening apparent.  No free air or free fluid in the abdomen. Mesenteric vessels appear patent.  Pelvis:  Wall thickening and inflammatory changes around the sigmoid colon with scattered diverticula.  Changes are consistent with diverticulitis.  No pericolonic abscess.  The uterus is surgically absent.  Ovaries are not enlarged.  Normal appendix. Bladder wall is not thickened.  No significant pelvic lymphadenopathy.  Normal alignment of the lumbar vertebrae.  IMPRESSION: Pericolonic inflammatory change in the sigmoid region consistent with diverticulitis.  No abscess.   Original Report Authenticated By: Marlon Pel, M.D.   Dg Abd Acute W/chest  03/08/2012  *RADIOLOGY REPORT*  Clinical Data: Abdominal pain for past month.  Nausea and dizziness.  ACUTE ABDOMEN SERIES (ABDOMEN 2 VIEW & CHEST 1 VIEW)  Comparison: No priors.  Findings: Lung volumes are low.  No consolidative airspace disease. No pleural effusions.  Pulmonary vasculature and the cardiomediastinal silhouette are within normal limits.  No pneumoperitoneum.  Supine and upright views of the abdomen demonstrate gas and stool scattered throughout the colon extending to the distal rectum. There are a few nondilated loops of gas-filled small bowel noted in the central abdomen and in the left side of the abdomen, which is nonspecific.  No pathologic dilatation of small bowel.  Surgical clips projecting over the right upper quadrant of the abdomen, consistent with prior cholecystectomy.  IMPRESSION: 1.  Nonspecific, nonobstructive bowel gas pattern. 2.  No pneumoperitoneum. 3.  Status post cholecystectomy. 4.  Low lung volumes without radiographic evidence of acute cardiopulmonary disease.  Original Report Authenticated By: Florencia Reasons, M.D.    Assessment/Plan Present on Admission:  46 year old female with acute diverticulitis  .Diverticulitis .Nausea and vomiting  Patient has had no vomiting at all since in the emergency department we'll place on by mouth Flagyl and ciprofloxacin. Place on IV fluids overnight we'll observe her overnight. If she is tolerating by mouth she probably be alcohol in the next 24 hours or so and be treated with a full course of antibiotics.  Felcia Huebert [phone] 03/10/2012, 3:07 AM

## 2012-03-10 NOTE — Progress Notes (Signed)
CARE MANAGEMENT NOTE 03/10/2012  Patient:  [name],[name]   Account Number:  [id number]  Date Initiated:  03/10/2012  Documentation initiated by:  Rosemary Holms  Subjective/Objective Assessment:   Pt admitted with abdominal pain/ Acute diverticulitis. PTA lived independently at home with spouse.     Action/Plan:   Spoke with pt at bedside. She stated she plans to go home but not until she can eat (per MD). No HH needs anticipated.   Anticipated DC Date:  03/11/2012   Anticipated DC Plan:  HOME/SELF CARE      DC Planning Services  CM consult      Choice offered to / List presented to:             Status of service:  In process, will continue to follow Medicare Important Message given?   (If response is "NO", the following Medicare IM given date fields will be blank) Date Medicare IM given:   Date Additional Medicare IM given:    Discharge Disposition:    Per UR Regulation:    If discussed at Long Length of Stay Meetings, dates discussed:    Comments:  03/10/12 1100 [name] Sedlak Leanord Hawking RN BSN

## 2012-03-10 NOTE — Progress Notes (Signed)
UR Chart Review Completed  

## 2012-03-11 LAB — BASIC METABOLIC PANEL
BUN: 6 mg/dL (ref 6–23)
CO2: 27 mEq/L (ref 19–32)
Calcium: 9.1 mg/dL (ref 8.4–10.5)
Creatinine, Ser: 0.76 mg/dL (ref 0.50–1.10)
GFR calc non Af Amer: >90 mL/min (ref >90)
Glucose, Bld: 98 mg/dL (ref 70–99)
Sodium: 138 mEq/L (ref 135–145)

## 2012-03-11 LAB — URINE CULTURE
Colony Count: NO GROWTH
Culture  Setup Time: 201304011356

## 2012-03-11 LAB — CBC
HCT: 39 % (ref 36.0–46.0)
Hemoglobin: 13.5 g/dL (ref 12.0–15.0)
MCH: 30.5 pg (ref 26.0–34.0)
MCHC: 34.6 g/dL (ref 30.0–36.0)
MCV: 88.2 fL (ref 78.0–100.0)
RBC: 4.42 MIL/uL (ref 3.87–5.11)

## 2012-03-11 MED ORDER — LOPERAMIDE HCL 2 MG PO CAPS
2.0000 mg | ORAL_CAPSULE | ORAL | Status: DC | PRN
  Administered 2012-03-11: 2 mg via ORAL
  Filled 2012-03-11: qty 1

## 2012-03-11 MED ORDER — SODIUM CHLORIDE 0.9 % IJ SOLN
INTRAMUSCULAR | Status: AC
  Filled 2012-03-11: qty 3

## 2012-03-11 NOTE — Progress Notes (Signed)
Subjective: Abd pain is doing better, no diarrhea.  She has had severe nausea overnight, poor po intake  Objective: Vital signs in last 24 hours: Temp:  [98 F (36.7 C)-98.6 F (37 C)] 98.6 F (37 C) (04/02 0516) Pulse Rate:  [98-104] 98  (04/02 0516) Resp:  [20] 20  (04/02 0516) BP: (110-125)/(75-85) 110/75 mmHg (04/02 0516) SpO2:  [95 %-97 %] 97 % (04/02 0516) Weight change:  Last BM Date: 03/09/12  Intake/Output from previous day: 04/01 0701 - 04/02 0700 In: 260 [P.O.:260] Out: -  Total I/O In: 120 [P.O.:120] Out: -    Physical Exam: General: Alert, awake, oriented x3, in no acute distress. HEENT: No bruits, no goiter. Heart: Regular rate and rhythm, without murmurs, rubs, gallops. Lungs: Clear to auscultation bilaterally. Abdomen: Soft, nontender, nondistended, positive bowel sounds. Extremities: No clubbing cyanosis or edema with positive pedal pulses. Neuro: Grossly intact, nonfocal.    Lab Results: Basic Metabolic Panel:  Basename 03/11/12 0550 03/10/12 0403  NA 138 134*  K 3.9 3.4*  CL 101 95*  CO2 27 28  GLUCOSE 98 107*  BUN 6 9  CREATININE 0.76 0.78  CALCIUM 9.1 9.0  MG -- --  PHOS -- --   Liver Function Tests:  [facility] 03/09/12 2336 03/08/12 1705  AST 15 20  ALT 24 27  ALKPHOS 67 59  BILITOT 0.5 0.4  PROT 7.0 6.9  ALBUMIN 3.7 3.7    Basename 03/09/12 2336 03/08/12 1705  LIPASE 28 32  AMYLASE -- --   No results found for this basename: AMMONIA:2 in the last 72 hours CBC:  Basename 03/11/12 0550 03/10/12 0403 03/09/12 2336 03/08/12 1705  WBC 9.4 11.4* -- --  NEUTROABS -- -- 9.2* 4.1  HGB 13.5 12.7 -- --  HCT 39.0 36.8 -- --  MCV 88.2 87.0 -- --  PLT 172 195 -- --   Cardiac Enzymes: No results found for this basename: CKTOTAL:3,CKMB:3,CKMBINDEX:3,TROPONINI:3 in the last 72 hours BNP: No results found for this basename: PROBNP:3 in the last 72 hours D-Dimer: No results found for this basename: DDIMER:2 in the last 72  hours CBG:  Basename 03/08/12 1606  GLUCAP 90   Hemoglobin A1C: No results found for this basename: HGBA1C in the last 72 hours Fasting Lipid Panel: No results found for this basename: CHOL,HDL,LDLCALC,TRIG,CHOLHDL,LDLDIRECT in the last 72 hours Thyroid Function Tests: No results found for this basename: TSH,T4TOTAL,FREET4,T3FREE,THYROIDAB in the last 72 hours Anemia Panel: No results found for this basename: VITAMINB12,FOLATE,FERRITIN,TIBC,IRON,RETICCTPCT in the last 72 hours Coagulation: No results found for this basename: LABPROT:2,INR:2 in the last 72 hours Urine Drug Screen: Drugs of Abuse  No results found for this basename: labopia, cocainscrnur, labbenz, amphetmu, thcu, labbarb    Alcohol Level: No results found for this basename: ETH:2 in the last 72 hours Urinalysis:  Basename 03/09/12 2345 03/08/12 1600  COLORURINE YELLOW YELLOW  LABSPEC 1.020 1.025  PHURINE 6.0 5.5  GLUCOSEU NEGATIVE NEGATIVE  HGBUR NEGATIVE NEGATIVE  BILIRUBINUR NEGATIVE NEGATIVE  KETONESUR NEGATIVE NEGATIVE  PROTEINUR NEGATIVE NEGATIVE  UROBILINOGEN 0.2 0.2  NITRITE NEGATIVE NEGATIVE  LEUKOCYTESUR NEGATIVE NEGATIVE    Recent Results (from the past 240 hour(s))  URINE CULTURE     Status: Normal   Collection Time   03/09/12 11:45 PM      Component Value Range Status Comment   Specimen Description URINE, CLEAN CATCH   Final    Special Requests NONE   Final    Culture  Setup Time [id number]   Final  Colony Count NO GROWTH   Final    Culture NO GROWTH   Final    Report Status 03/11/2012 FINAL   Final     Studies/Results: Ct Abdomen Pelvis W Contrast  03/10/2012  *RADIOLOGY REPORT*  Clinical Data: Left lower quadrant abdominal pain for 2 days.  CT ABDOMEN AND PELVIS WITH CONTRAST  Technique:  Multidetector CT imaging of the abdomen and pelvis was performed following the standard protocol during bolus administration of intravenous contrast.  Contrast: OMNIPAQUE IOHEXOL 300 MG/ML IJ  SOLN  Comparison: 10/07/2011  Findings: Visualized lung bases are clear.  Diffuse low attenuation change throughout the liver consistent with fatty infiltration.  The small low attenuation lesion in the medial segment left lobe of liver measuring about 8 mm diameter, likely representing cyst or hemangioma.  Surgical absence of the gallbladder.  Small accessory spleen.  The pancreas, spleen, adrenal glands, kidneys, abdominal aorta, and retroperitoneal lymph nodes are unremarkable.  Stomach and small bowel are not distended. No wall thickening.  Stool filled colon without distension or wall thickening apparent.  No free air or free fluid in the abdomen. Mesenteric vessels appear patent.  Pelvis:  Wall thickening and inflammatory changes around the sigmoid colon with scattered diverticula.  Changes are consistent with diverticulitis.  No pericolonic abscess.  The uterus is surgically absent.  Ovaries are not enlarged.  Normal appendix. Bladder wall is not thickened.  No significant pelvic lymphadenopathy.  Normal alignment of the lumbar vertebrae.  IMPRESSION: Pericolonic inflammatory change in the sigmoid region consistent with diverticulitis.  No abscess.  Original Report Authenticated By: Marlon Pel, M.D.    Medications: Scheduled Meds:   . ciprofloxacin  400 mg Intravenous Q12H  . hydrochlorothiazide  25 mg Oral QHS  . metronidazole  500 mg Intravenous Q8H  . sodium chloride      . sodium chloride      . venlafaxine  150 mg Oral QHS  . DISCONTD: sodium chloride   Intravenous STAT   Continuous Infusions:   . sodium chloride 75 mL/hr at 03/10/12 0443   PRN Meds:.morphine, ondansetron (ZOFRAN) IV, ondansetron, oxyCODONE-acetaminophen  Assessment/Plan:  Principal Problem:  *Diverticulitis Active Problems:  Nausea and vomiting  Hypertension  Plan:  Patient has not had a fever, and clinically seems to be making slow improvement.  She is requesting to try to eat solid food.  We will  advance her diet, and if she tolerates this, she can be changed to po abx and discharged home.   LOS: 2 days   Pia Jedlicka [facility] Pager: [phone] 03/11/2012, 11:44 AM

## 2012-03-12 MED ORDER — LOPERAMIDE HCL 2 MG PO CAPS: 2.0000 mg | ORAL_CAPSULE | Freq: Four times a day (QID) | ORAL | Status: AC | PRN

## 2012-03-12 MED ORDER — METRONIDAZOLE 500 MG PO TABS: 500.0000 mg | ORAL_TABLET | Freq: Three times a day (TID) | ORAL | Status: AC

## 2012-03-12 MED ORDER — CIPROFLOXACIN HCL 500 MG PO TABS: 500.0000 mg | ORAL_TABLET | Freq: Two times a day (BID) | ORAL | Status: AC

## 2012-03-12 MED ORDER — ONDANSETRON HCL 4 MG PO TABS: 4.0000 mg | ORAL_TABLET | Freq: Four times a day (QID) | ORAL | Status: AC | PRN

## 2012-03-12 NOTE — Progress Notes (Signed)
Pt discharged home today per Dr. Kerry Hough. Lyda Jester provided pt with prescriptions, home medication list, and discharge instructions. Verbalized understanding. Pt left floor via WC in stable condition accompanied by NT.

## 2012-03-12 NOTE — Discharge Summary (Signed)
 Physician Discharge Summary  Patient ID: [name] MRN: [id number] DOB/AGE: 1966-03-04 46 y.o.  Admit date: 03/09/2012 Discharge date: 03/12/2012  Primary Care Physician:  No primary provider on file.   Discharge Diagnoses:    Principal Problem:  *Diverticulitis Active Problems:  Nausea and vomiting  Hypertension    Medication List  As of 03/12/2012 11:13 AM   STOP taking these medications         naproxen 500 MG tablet         TAKE these medications         alprazolam 2 MG tablet   Commonly known as: XANAX   Take 2 mg by mouth 4 (four) times daily. Patient states she takes 1 every 2 weeks if needed      ciprofloxacin 500 MG tablet   Commonly known as: CIPRO   Take 1 tablet (500 mg total) by mouth 2 (two) times daily.      esomeprazole 20 MG capsule   Commonly known as: NEXIUM   Take 20 mg by mouth daily before breakfast.      hydrochlorothiazide 25 MG tablet   Commonly known as: HYDRODIURIL   Take 25 mg by mouth at bedtime.      loperamide 2 MG capsule   Commonly known as: IMODIUM   Take 1 capsule (2 mg total) by mouth 4 (four) times daily as needed for diarrhea or loose stools.      metroNIDAZOLE 500 MG tablet   Commonly known as: FLAGYL   Take 1 tablet (500 mg total) by mouth 3 (three) times daily.      ondansetron 4 MG tablet   Commonly known as: ZOFRAN   Take 1 tablet (4 mg total) by mouth every 6 (six) hours as needed for nausea.      oxyCODONE-acetaminophen 10-325 MG per tablet   Commonly known as: PERCOCET   Take 1 tablet by mouth 4 (four) times daily as needed. pain      venlafaxine 150 MG 24 hr capsule   Commonly known as: EFFEXOR-XR   Take 150 mg by mouth at bedtime.           Discharge Exam: Blood pressure 127/84, pulse 78, temperature 98.1 F (36.7 C), temperature source Oral, resp. rate 18, height 5\' 6"  (1.676 m), weight 102.059 kg (225 lb), SpO2 98.00%. NAD CTA B S1, S2, RRR Soft, NT, BS+ No edema b/l  Disposition and  Follow-up:  Follow up with primary doctor in 2 weeks  Consults:  none   Significant Diagnostic Studies:  No results found.  Brief H and P: For complete details please refer to admission H and P, but in brief 46 year old female who has had multiple visits to the emergency department over the last week who comes in again because of the same nausea vomiting diarrhea and left lower quadrant abdominal pain. She denies any fevers but feels like her pain is progressively gotten worse. CAT scan of her abdomen and pelvis today was done which shows a mild case of acute diverticulitis. She has not been on any antibiotics. There is no blood in her vomit or diarrhea.   Hospital Course:  Principal Problem:  *Diverticulitis Active Problems:  Nausea and vomiting  Hypertension  Patient was admitted with abd pain, vomiting and acute diverticulitis.  Patient was given IV antibiotics and has since improved.  She is now afebrile.  Her WBC count has normalized.  She is tolerating a solid diet without vomiting.  She does have occasional  nausea, but feels she can manage this at home.  She will be discharged today and follow up with her primary care doctor.    Time spent on Discharge:  Signed: , [facility] Pager: [phone] 03/12/2012, 11:13 AM

## 2012-03-12 NOTE — Progress Notes (Signed)
Pt discharged home today per Dr. Emelda Fear. Pt's IV site d/c'd and WNL. Pt's VS stable at this time. Pt provided with home medication list, discharge instructions and prescriptions. Pt verbalized understanding. Pt left floor via WC in stable condition accompanied by NT.

## 2012-03-12 NOTE — Discharge Instructions (Signed)
Diverticulitis Small pockets or "bubbles" can develop in the wall of the intestine. Diverticulitis is when those pockets become infected and inflamed. This causes stomach pain (usually on the left side). HOME CARE  Take all medicine as told by your doctor.   Try a clear liquid diet (broth, tea, or water) for as long as told by your doctor.   Keep all follow-up visits with your doctor.   You may be put on a low-fiber diet once you start feeling better. Here are foods that have low-fiber:   White breads, cereals, rice, and pasta.   Cooked fruits and vegetables or soft fresh fruits and vegetables without the skin.   Ground or well-cooked tender beef, ham, veal, lamb, pork, or poultry.   Eggs and seafood.   After you are doing well on the low-fiber diet, you may be put on a high-fiber diet. Here are ways to increase your fiber:   Choose whole-grain breads, cereals, pasta, and brown rice.   Choose fruits and vegetables with skin on. Do not overcook the vegetables.   Choose nuts, seeds, legumes, dried peas, beans, and lentils.   Look for food products that have more than 3 grams of fiber per serving on the food label.  GET HELP RIGHT AWAY IF:  Your pain does not get better or gets worse.   You have trouble eating food.   You are not pooping (having bowel movements) like normal.   You have a temperature by mouth above 102 F (38.9 C), not controlled by medicine.   You keep throwing up (vomiting).   You have bloody or black, tarry poop (stools).   You are getting worse and not better.  MAKE SURE YOU:   Understand these instructions.   Will watch your condition.   Will get help right away if you are not doing well or get worse.  Document Released: 05/14/2008 Document Revised: 11/15/2011 Document Reviewed: 10/17/2009 [employer] Patient Information 2012 [employer]. 

## 2012-08-16 ENCOUNTER — Encounter (HOSPITAL_COMMUNITY): Payer: Self-pay | Admitting: Emergency Medicine

## 2012-08-16 ENCOUNTER — Emergency Department (HOSPITAL_COMMUNITY): Payer: Medicaid Other

## 2012-08-16 ENCOUNTER — Emergency Department (HOSPITAL_COMMUNITY)
Admission: EM | Admit: 2012-08-16 | Discharge: 2012-08-16 | Disposition: A | Discharge status: Home / Self Care | Payer: Medicaid Other | Attending: Emergency Medicine | Admitting: Emergency Medicine

## 2012-08-16 DIAGNOSIS — F3289 Other specified depressive episodes: Secondary | ICD-10-CM | POA: Insufficient documentation

## 2012-08-16 DIAGNOSIS — I10 Essential (primary) hypertension: Secondary | ICD-10-CM | POA: Insufficient documentation

## 2012-08-16 DIAGNOSIS — E7800 Pure hypercholesterolemia, unspecified: Secondary | ICD-10-CM | POA: Insufficient documentation

## 2012-08-16 DIAGNOSIS — F329 Major depressive disorder, single episode, unspecified: Secondary | ICD-10-CM | POA: Insufficient documentation

## 2012-08-16 DIAGNOSIS — R05 Cough: Secondary | ICD-10-CM | POA: Insufficient documentation

## 2012-08-16 DIAGNOSIS — Z8489 Family history of other specified conditions: Secondary | ICD-10-CM | POA: Insufficient documentation

## 2012-08-16 DIAGNOSIS — R062 Wheezing: Secondary | ICD-10-CM | POA: Insufficient documentation

## 2012-08-16 DIAGNOSIS — Z888 Allergy status to other drugs, medicaments and biological substances status: Secondary | ICD-10-CM | POA: Insufficient documentation

## 2012-08-16 DIAGNOSIS — R059 Cough, unspecified: Secondary | ICD-10-CM | POA: Insufficient documentation

## 2012-08-16 DIAGNOSIS — Z8249 Family history of ischemic heart disease and other diseases of the circulatory system: Secondary | ICD-10-CM | POA: Insufficient documentation

## 2012-08-16 MED ORDER — PREDNISONE 20 MG PO TABS
60.0000 mg | ORAL_TABLET | Freq: Once | ORAL | Status: AC
  Administered 2012-08-16: 60 mg via ORAL
  Filled 2012-08-16: qty 3

## 2012-08-16 MED ORDER — AZITHROMYCIN 250 MG PO TABS: ORAL_TABLET | ORAL | Status: DC

## 2012-08-16 MED ORDER — ALBUTEROL SULFATE HFA 108 (90 BASE) MCG/ACT IN AERS: 1.0000 | INHALATION_SPRAY | Freq: Four times a day (QID) | RESPIRATORY_TRACT | Status: DC | PRN

## 2012-08-16 MED ORDER — ALBUTEROL SULFATE (5 MG/ML) 0.5% IN NEBU
5.0000 mg | INHALATION_SOLUTION | Freq: Once | RESPIRATORY_TRACT | Status: AC
  Administered 2012-08-16: 5 mg via RESPIRATORY_TRACT
  Filled 2012-08-16: qty 1

## 2012-08-16 MED ORDER — PREDNISONE 10 MG PO TABS: 20.0000 mg | ORAL_TABLET | Freq: Every day | ORAL | Status: DC

## 2012-08-16 MED ORDER — IBUPROFEN 800 MG PO TABS
800.0000 mg | ORAL_TABLET | Freq: Once | ORAL | Status: AC
  Administered 2012-08-16: 800 mg via ORAL
  Filled 2012-08-16: qty 1

## 2012-08-16 MED ORDER — AZITHROMYCIN 250 MG PO TABS
500.0000 mg | ORAL_TABLET | Freq: Once | ORAL | Status: AC
  Administered 2012-08-16: 500 mg via ORAL
  Filled 2012-08-16: qty 2

## 2012-08-16 NOTE — ED Notes (Signed)
Pt's questions answered verbalized understanding of dc instructions

## 2012-08-16 NOTE — ED Provider Notes (Signed)
History     CSN: [id number]  Arrival date & time 08/16/12  0424   First MD Initiated Contact with Patient 08/16/12 0441      Chief Complaint  Patient presents with  . Cough  . Chest Pain    (Consider location/radiation/quality/duration/timing/severity/associated sxs/prior treatment) HPI  [name] is a 46 y.o. female who presents to the Emergency Department complaining of cough, congestion and chest discomfort associated with the cough x 2 weeks. Denies fever, chills, nausea, vomiting. Has taken no medicines.     Past Medical History  Diagnosis Date  . Diverticulitis   . Depression   . Hypertension   . High cholesterol   . Benign tumor of brain     Past Surgical History  Procedure Date  . Brain tumor excision   . Tubal ligation   . Abdominal hysterectomy   . Cholecystectomy   . Endometrial ablation   . Carpal tunnel release     Family History  Problem Relation Age of Onset  . Hypertension Mother   . Hyperlipidemia Mother   . Hypertension Father   . Hyperlipidemia Father   . Heart failure Father     History  Substance Use Topics  . Smoking status: Never Smoker   . Smokeless tobacco: Not on file  . Alcohol Use: No    OB History    Grav Para Term Preterm Abortions TAB SAB Ect Mult Living   2 2              Review of Systems  Constitutional: Negative for fever.       10 Systems reviewed and are negative for acute change except as noted in the HPI.  HENT: Negative for congestion.   Eyes: Negative for discharge and redness.  Respiratory: Positive for cough, chest tightness and wheezing. Negative for shortness of breath.   Cardiovascular: Negative for chest pain.  Gastrointestinal: Negative for vomiting and abdominal pain.  Musculoskeletal: Negative for back pain.  Skin: Negative for rash.  Neurological: Negative for syncope, numbness and headaches.  Psychiatric/Behavioral:       No behavior change.    Allergies  Dilaudid  Home  Medications   Current Outpatient Rx  Name Route Sig Dispense Refill  . ALPRAZOLAM 2 MG PO TABS Oral Take 2 mg by mouth 4 (four) times daily. Patient states she takes 1 every 2 weeks if needed     . ESOMEPRAZOLE MAGNESIUM 20 MG PO CPDR Oral Take 20 mg by mouth daily before breakfast.    . HYDROCHLOROTHIAZIDE 25 MG PO TABS Oral Take 25 mg by mouth at bedtime.    . OXYCODONE-ACETAMINOPHEN 10-325 MG PO TABS Oral Take 1 tablet by mouth 4 (four) times daily as needed. pain    . VENLAFAXINE HCL ER 150 MG PO CP24 Oral Take 150 mg by mouth at bedtime.       BP 144/102  Pulse 112  Temp 98.2 F (36.8 C) (Oral)  Resp 20  Ht 5\' 6"  (1.676 m)  Wt 220 lb (99.791 kg)  BMI 35.51 kg/m2  SpO2 98%  Physical Exam  Nursing note and vitals reviewed. Constitutional: She appears well-developed and well-nourished. No distress.       Awake, alert, nontoxic appearance.  HENT:  Head: Atraumatic.  Eyes: Right eye exhibits no discharge. Left eye exhibits no discharge.  Neck: Neck supple.  Pulmonary/Chest: Effort normal. No respiratory distress. She has wheezes. She exhibits no tenderness.       Coarse cough  Abdominal:  Soft. There is no tenderness. There is no rebound.  Musculoskeletal: She exhibits no tenderness.       Baseline ROM, no obvious new focal weakness.  Neurological:       Mental status and motor strength appears baseline for patient and situation.  Skin: No rash noted.  Psychiatric: She has a normal mood and affect.    ED Course  Procedures (including critical care time) Dg Chest 2 View  08/16/2012  *RADIOLOGY REPORT*  Clinical Data: Cough and congestion.  Chest pain and pressure.  CHEST - 2 VIEW  Comparison: 05/20/2009.  Findings: Shallow inspiration with elevation right hemidiaphragm. Atelectasis in the lung bases. The heart size and pulmonary vascularity are normal.  No focal airspace consolidation.  No blunting of the costophrenic angles. No pneumothorax.  Mediastinal contours appear  intact.  Surgical clips in the right upper quadrant.  No significant change since previous study.  IMPRESSION: Shallow inspiration with linear atelectasis in the lung bases.  No focal consolidation.   Original Report Authenticated By: Marlon Pel, M.D.      No diagnosis found.    MDM  Patient with cough and chest discomfort associated with the cough x 2 weeks. Chest xray with linear atelectasis. Given nebulizer treatment with improvement in wheezing. Initiated antibiotic and steroid therapy.  Pt feels improved after observation and/or treatment in ED.Pt stable in ED with no significant deterioration in condition.The patient appears reasonably screened and/or stabilized for discharge and I doubt any other medical condition or other [facility] requiring further screening, evaluation, or treatment in the ED at this time prior to discharge.  MDM Reviewed: nursing note and vitals Interpretation: x-ray           Nicoletta Dress. Colon Branch, MD 08/16/12 0630

## 2012-08-16 NOTE — ED Notes (Signed)
Patient complaining of cough and chest pain x 2 weeks.

## 2013-01-20 ENCOUNTER — Ambulatory Visit: Payer: Medicaid Other | Admitting: Physical Therapy

## 2013-02-02 ENCOUNTER — Ambulatory Visit: Payer: Medicaid Other | Attending: Orthopedic Surgery | Admitting: Physical Therapy

## 2013-02-02 DIAGNOSIS — IMO0001 Reserved for inherently not codable concepts without codable children: Secondary | ICD-10-CM | POA: Insufficient documentation

## 2013-02-02 DIAGNOSIS — R5381 Other malaise: Secondary | ICD-10-CM | POA: Insufficient documentation

## 2013-02-02 DIAGNOSIS — M25619 Stiffness of unspecified shoulder, not elsewhere classified: Secondary | ICD-10-CM | POA: Insufficient documentation

## 2013-02-02 DIAGNOSIS — M25519 Pain in unspecified shoulder: Secondary | ICD-10-CM | POA: Insufficient documentation

## 2013-02-05 ENCOUNTER — Ambulatory Visit: Payer: Medicaid Other | Admitting: Physical Therapy

## 2013-02-10 ENCOUNTER — Encounter: Payer: Medicaid Other | Admitting: Physical Therapy

## 2013-02-12 ENCOUNTER — Encounter: Payer: Medicaid Other | Admitting: Physical Therapy

## 2013-02-17 ENCOUNTER — Ambulatory Visit: Payer: Medicaid Other | Attending: Orthopedic Surgery | Admitting: Physical Therapy

## 2013-02-17 DIAGNOSIS — IMO0001 Reserved for inherently not codable concepts without codable children: Secondary | ICD-10-CM | POA: Insufficient documentation

## 2013-02-17 DIAGNOSIS — M25619 Stiffness of unspecified shoulder, not elsewhere classified: Secondary | ICD-10-CM | POA: Insufficient documentation

## 2013-02-17 DIAGNOSIS — R5381 Other malaise: Secondary | ICD-10-CM | POA: Insufficient documentation

## 2013-02-17 DIAGNOSIS — M25519 Pain in unspecified shoulder: Secondary | ICD-10-CM | POA: Insufficient documentation

## 2013-12-10 ENCOUNTER — Emergency Department (HOSPITAL_COMMUNITY)
Admission: EM | Admit: 2013-12-10 | Discharge: 2013-12-10 | Disposition: A | Discharge status: Home / Self Care | Payer: Medicaid Other | Attending: Emergency Medicine | Admitting: Emergency Medicine

## 2013-12-10 ENCOUNTER — Encounter (HOSPITAL_COMMUNITY): Payer: Self-pay | Admitting: Emergency Medicine

## 2013-12-10 ENCOUNTER — Emergency Department (HOSPITAL_COMMUNITY): Payer: Medicaid Other

## 2013-12-10 DIAGNOSIS — I10 Essential (primary) hypertension: Secondary | ICD-10-CM | POA: Insufficient documentation

## 2013-12-10 DIAGNOSIS — R0789 Other chest pain: Secondary | ICD-10-CM | POA: Insufficient documentation

## 2013-12-10 DIAGNOSIS — J069 Acute upper respiratory infection, unspecified: Secondary | ICD-10-CM | POA: Insufficient documentation

## 2013-12-10 DIAGNOSIS — Z86011 Personal history of benign neoplasm of the brain: Secondary | ICD-10-CM | POA: Insufficient documentation

## 2013-12-10 DIAGNOSIS — Z8719 Personal history of other diseases of the digestive system: Secondary | ICD-10-CM | POA: Insufficient documentation

## 2013-12-10 DIAGNOSIS — E7800 Pure hypercholesterolemia, unspecified: Secondary | ICD-10-CM | POA: Insufficient documentation

## 2013-12-10 DIAGNOSIS — Z792 Long term (current) use of antibiotics: Secondary | ICD-10-CM | POA: Insufficient documentation

## 2013-12-10 DIAGNOSIS — F3289 Other specified depressive episodes: Secondary | ICD-10-CM | POA: Insufficient documentation

## 2013-12-10 DIAGNOSIS — Z9089 Acquired absence of other organs: Secondary | ICD-10-CM | POA: Insufficient documentation

## 2013-12-10 DIAGNOSIS — IMO0002 Reserved for concepts with insufficient information to code with codable children: Secondary | ICD-10-CM | POA: Insufficient documentation

## 2013-12-10 DIAGNOSIS — R071 Chest pain on breathing: Secondary | ICD-10-CM | POA: Insufficient documentation

## 2013-12-10 DIAGNOSIS — F329 Major depressive disorder, single episode, unspecified: Secondary | ICD-10-CM | POA: Insufficient documentation

## 2013-12-10 DIAGNOSIS — Z79899 Other long term (current) drug therapy: Secondary | ICD-10-CM | POA: Insufficient documentation

## 2013-12-10 MED ORDER — ALBUTEROL SULFATE HFA 108 (90 BASE) MCG/ACT IN AERS
2.0000 | INHALATION_SPRAY | RESPIRATORY_TRACT | Status: DC | PRN
  Administered 2013-12-10: 2 via RESPIRATORY_TRACT
  Filled 2013-12-10: qty 6.7

## 2013-12-10 MED ORDER — AEROCHAMBER PLUS FLO-VU MEDIUM MISC
1.0000 | Freq: Once | Status: DC
  Filled 2013-12-10: qty 1

## 2013-12-10 MED ORDER — PREDNISONE 10 MG PO TABS: 20.0000 mg | ORAL_TABLET | Freq: Every day | ORAL | Status: DC

## 2013-12-10 MED ORDER — ALBUTEROL SULFATE (2.5 MG/3ML) 0.083% IN NEBU
5.0000 mg | INHALATION_SOLUTION | Freq: Once | RESPIRATORY_TRACT | Status: AC
  Administered 2013-12-10: 5 mg via RESPIRATORY_TRACT
  Filled 2013-12-10: qty 6

## 2013-12-10 MED ORDER — PREDNISONE 10 MG PO TABS
60.0000 mg | ORAL_TABLET | Freq: Once | ORAL | Status: AC
  Administered 2013-12-10: 60 mg via ORAL
  Filled 2013-12-10 (×2): qty 1

## 2013-12-10 NOTE — ED Provider Notes (Signed)
CSN: [id number]     Arrival date & time 12/10/13  1428 History  This chart was scribed for Shaune Pollack, MD by Roxan Diesel, ED scribe.  This patient was seen in room APA18/APA18 and the patient's care was started at 4:14 PM.   Chief Complaint  Patient presents with  . Shortness of Breath    Patient is a 48 y.o. female presenting with shortness of breath. The history is provided by the patient. No language interpreter was used.  Shortness of Breath Severity:  Moderate Duration:  2 days Timing: persistent. Progression:  Worsening Chronicity:  New Context: URI   Associated symptoms: chest pain and cough   Associated symptoms: no fever and no vomiting   Risk factors: no hx of PE/DVT and no tobacco use     HPI Comments: [name] is a 48 y.o. female with h/o diverticulitis, benign brain tumor, depression, HTN and high cholesterol who presents to the Emergency Department complaining of 2 days of SOB with associated chest pain and URI symptoms.  Pt states for 3 weeks she has had cold symptoms including cough and nasal congestion.  She was seen at the [facility] ED and placed on a 5-day course of Zithromax which she finished 3 days ago.  She states she initially felt as if she was improving but her cough never completely resolved.  2 days ago she developed SOB and pain to her substernal chest.  She states it hurts to breathe and cough.  She also states her cough has been worsening at night.  Cough is productive of "very little" sputum.  She denies fevers, chills, generalized body aches, nausea, vomiting, or decreased appetite.  Pt notes that her 46-yo son has URI symptoms currently including cough, congestion and rhinorrhea.  She did not receive a flu shot this year.  She does not smoke.  She denies h/o asthma or lung disease.  She has h/o cholecystectomy.  She denies h/o DVT/PE, hormone therapy, or leg pain or swelling.   Past Medical History  Diagnosis Date  . Diverticulitis   .  Depression   . Hypertension   . High cholesterol   . Benign tumor of brain     Past Surgical History  Procedure Laterality Date  . Brain tumor excision    . Tubal ligation    . Abdominal hysterectomy    . Cholecystectomy    . Endometrial ablation    . Carpal tunnel release      Family History  Problem Relation Age of Onset  . Hypertension Mother   . Hyperlipidemia Mother   . Hypertension Father   . Hyperlipidemia Father   . Heart failure Father     History  Substance Use Topics  . Smoking status: Never Smoker   . Smokeless tobacco: Not on file  . Alcohol Use: No    OB History   Grav Para Term Preterm Abortions TAB SAB Ect Mult Living   2 2              Review of Systems  Constitutional: Negative for fever, chills and appetite change.  HENT: Positive for congestion.   Respiratory: Positive for cough and shortness of breath.   Cardiovascular: Positive for chest pain.  Gastrointestinal: Negative for nausea and vomiting.  Musculoskeletal: Negative for myalgias.  All other systems reviewed and are negative.     Allergies  Dilaudid  Home Medications   Current Outpatient Rx  Name  Route  Sig  Dispense  Refill  .  EXPIRED: albuterol (PROVENTIL HFA;VENTOLIN HFA) 108 (90 BASE) MCG/ACT inhaler   Inhalation   Inhale 1-2 puffs into the lungs every 6 (six) hours as needed for wheezing.   1 Inhaler   0   . alprazolam (XANAX) 2 MG tablet   Oral   Take 2 mg by mouth 4 (four) times daily. Patient states she takes 1 every 2 weeks if needed          . azithromycin (ZITHROMAX) 250 MG tablet      1 every day until finished.   4 tablet   0   . esomeprazole (NEXIUM) 20 MG capsule   Oral   Take 20 mg by mouth daily before breakfast.         . hydrochlorothiazide (HYDRODIURIL) 25 MG tablet   Oral   Take 25 mg by mouth at bedtime.         Marland Kitchen oxyCODONE-acetaminophen (PERCOCET) 10-325 MG per tablet   Oral   Take 1 tablet by mouth 4 (four) times daily as  needed. pain         . predniSONE (DELTASONE) 10 MG tablet   Oral   Take 2 tablets (20 mg total) by mouth daily.   10 tablet   0   . venlafaxine (EFFEXOR-XR) 150 MG 24 hr capsule   Oral   Take 150 mg by mouth at bedtime.           BP 129/76  Pulse 81  Temp(Src) 98.1 F (36.7 C) (Oral)  Resp 18  Ht 5\' 6"  (1.676 m)  Wt 220 lb (99.791 kg)  BMI 35.53 kg/m2  SpO2 99%  Physical Exam  Nursing note and vitals reviewed. Constitutional: She is oriented to person, place, and time. She appears well-developed and well-nourished. No distress.  Morbidly obese  HENT:  Head: Normocephalic and atraumatic.  Eyes: EOM are normal.  Neck: Neck supple. No tracheal deviation present.  Cardiovascular: Normal rate.   Pulmonary/Chest: Effort normal. No respiratory distress. She has decreased breath sounds. She has no rhonchi. She exhibits tenderness.  Decreased breath sounds bilaterally Diffusely tender to anterior chest wall  Musculoskeletal: Normal range of motion.  Neurological: She is alert and oriented to person, place, and time.  Skin: Skin is warm and dry.  Psychiatric: She has a normal mood and affect. Her behavior is normal.    ED Course  Procedures (including critical care time)  DIAGNOSTIC STUDIES: Oxygen Saturation is 99% on room air, normal by my interpretation.    COORDINATION OF CARE: 4:19 PM-Discussed treatment plan which includes CXR with pt at bedside and pt agreed to plan.    Labs Review Labs Reviewed - No data to display   Imaging Review Dg Chest 2 View  12/10/2013   CLINICAL DATA:  Chest and back pain with cough for 3 weeks  EXAM: CHEST  2 VIEW  COMPARISON:  08/16/2012  FINDINGS: The heart size and mediastinal contours are within normal limits. Both lungs are clear. The visualized skeletal structures are unremarkable.  IMPRESSION: No active cardiopulmonary disease.   Electronically Signed   By: Skipper Cliche M.D.   On: 12/10/2013 17:23    EKG Interpretation    None       MDM  No diagnosis found. I personally performed the services described in this documentation, which was scribed in my presence. The recorded information has been reviewed and considered.  CXR clear.  Plan prednisone and albuterol mdi.  Return precautions discussed and patient voices understanding.  Shaune Pollack, MD 12/10/13 [phone]

## 2013-12-10 NOTE — ED Notes (Signed)
Cough/congestion x 3 weeks. Finished a round of antibiotics. Pressure behind right eye. Dry cough. Pain to epigastric area and back, hurts to breathe.no resp distress or sob noted at this time.

## 2013-12-10 NOTE — Discharge Instructions (Signed)
Acute Bronchitis  Bronchitis is when the airways that extend from the windpipe into the lungs get red, puffy, and painful (inflamed). Bronchitis often causes thick spit (mucus) to develop. This leads to a cough. A cough is the most common symptom of bronchitis.  In acute bronchitis, the condition usually begins suddenly and goes away over time (usually in 2 weeks). Smoking, allergies, and asthma can make bronchitis worse. Repeated episodes of bronchitis may cause more lung problems.  HOME CARE  · Rest.  · Drink enough fluids to keep your pee (urine) clear or pale yellow (unless you need to limit fluids as told by your doctor).  · Only take over-the-counter or prescription medicines as told by your doctor.  · Avoid smoking and secondhand smoke. These can make bronchitis worse. If you are a smoker, think about using nicotine gum or skin patches. Quitting smoking will help your lungs heal faster.  · Reduce the chance of getting bronchitis again by:  · Washing your hands often.  · Avoiding people with cold symptoms.  · Trying not to touch your hands to your mouth, nose, or eyes.  · Follow up with your doctor as told.  GET HELP IF:  Your symptoms do not improve after 1 week of treatment. Symptoms include:  · Cough.  · Fever.  · Coughing up thick spit.  · Body aches.  · Chest congestion.  · Chills.  · Shortness of breath.  · Sore throat.  GET HELP RIGHT AWAY IF:   · You have an increased fever.  · You have chills.  · You have severe shortness of breath.  · You have bloody thick spit (sputum).  · You throw up (vomit) often.  · You lose too much body fluid (dehydration).  · You have a severe headache.  · You faint.  MAKE SURE YOU:   · Understand these instructions.  · Will watch your condition.  · Will get help right away if you are not doing well or get worse.  Document Released: 05/14/2008 Document Revised: 07/29/2013 Document Reviewed: 05/19/2013  [employer]® Patient Information ©2014 [employer].

## 2014-08-06 ENCOUNTER — Encounter (HOSPITAL_COMMUNITY): Payer: Self-pay | Admitting: Emergency Medicine

## 2014-08-06 ENCOUNTER — Emergency Department (HOSPITAL_COMMUNITY)
Admission: EM | Admit: 2014-08-06 | Discharge: 2014-08-06 | Disposition: A | Discharge status: Home / Self Care | Payer: Medicaid Other | Attending: Emergency Medicine | Admitting: Emergency Medicine

## 2014-08-06 DIAGNOSIS — F3289 Other specified depressive episodes: Secondary | ICD-10-CM | POA: Insufficient documentation

## 2014-08-06 DIAGNOSIS — Z4801 Encounter for change or removal of surgical wound dressing: Secondary | ICD-10-CM | POA: Diagnosis present

## 2014-08-06 DIAGNOSIS — I10 Essential (primary) hypertension: Secondary | ICD-10-CM | POA: Insufficient documentation

## 2014-08-06 DIAGNOSIS — Z862 Personal history of diseases of the blood and blood-forming organs and certain disorders involving the immune mechanism: Secondary | ICD-10-CM | POA: Insufficient documentation

## 2014-08-06 DIAGNOSIS — N61 Mastitis without abscess: Secondary | ICD-10-CM | POA: Insufficient documentation

## 2014-08-06 DIAGNOSIS — IMO0002 Reserved for concepts with insufficient information to code with codable children: Secondary | ICD-10-CM | POA: Insufficient documentation

## 2014-08-06 DIAGNOSIS — Z792 Long term (current) use of antibiotics: Secondary | ICD-10-CM | POA: Diagnosis not present

## 2014-08-06 DIAGNOSIS — F329 Major depressive disorder, single episode, unspecified: Secondary | ICD-10-CM | POA: Insufficient documentation

## 2014-08-06 DIAGNOSIS — Z86011 Personal history of benign neoplasm of the brain: Secondary | ICD-10-CM | POA: Diagnosis not present

## 2014-08-06 DIAGNOSIS — Z79899 Other long term (current) drug therapy: Secondary | ICD-10-CM | POA: Insufficient documentation

## 2014-08-06 DIAGNOSIS — E7800 Pure hypercholesterolemia, unspecified: Secondary | ICD-10-CM | POA: Diagnosis not present

## 2014-08-06 DIAGNOSIS — N611 Abscess of the breast and nipple: Secondary | ICD-10-CM

## 2014-08-06 DIAGNOSIS — R630 Anorexia: Secondary | ICD-10-CM | POA: Diagnosis not present

## 2014-08-06 DIAGNOSIS — Z8639 Personal history of other endocrine, nutritional and metabolic disease: Secondary | ICD-10-CM | POA: Insufficient documentation

## 2014-08-06 LAB — CBC WITH DIFFERENTIAL/PLATELET
BASOS ABS: 0.1 10*3/uL (ref 0.0–0.1)
Basophils Relative: 1 % (ref 0–1)
EOS PCT: 1 % (ref 0–5)
Eosinophils Absolute: 0.1 10*3/uL (ref 0.0–0.7)
HCT: 36.8 % (ref 36.0–46.0)
Hemoglobin: 12.7 g/dL (ref 12.0–15.0)
LYMPHS PCT: 27 % (ref 12–46)
Lymphs Abs: 1.9 10*3/uL (ref 0.7–4.0)
MCH: 29.1 pg (ref 26.0–34.0)
MCHC: 34.5 g/dL (ref 30.0–36.0)
MCV: 84.2 fL (ref 78.0–100.0)
Monocytes Absolute: 0.4 10*3/uL (ref 0.1–1.0)
Monocytes Relative: 6 % (ref 3–12)
NEUTROS ABS: 4.5 10*3/uL (ref 1.7–7.7)
NEUTROS PCT: 65 % (ref 43–77)
Platelets: 208 10*3/uL (ref 150–400)
RBC: 4.37 MIL/uL (ref 3.87–5.11)
RDW: 12.9 % (ref 11.5–15.5)
WBC: 7 10*3/uL (ref 4.0–10.5)

## 2014-08-06 LAB — I-STAT CG4 LACTIC ACID, ED: Lactic Acid, Venous: 0.81 mmol/L (ref 0.5–2.2)

## 2014-08-06 LAB — BASIC METABOLIC PANEL
ANION GAP: 10 (ref 5–15)
BUN: 13 mg/dL (ref 6–23)
CO2: 28 mEq/L (ref 19–32)
Calcium: 8.9 mg/dL (ref 8.4–10.5)
Chloride: 101 mEq/L (ref 96–112)
Creatinine, Ser: 0.75 mg/dL (ref 0.50–1.10)
Glucose, Bld: 89 mg/dL (ref 70–99)
POTASSIUM: 4.5 meq/L (ref 3.7–5.3)
SODIUM: 139 meq/L (ref 137–147)

## 2014-08-06 MED ORDER — LIDOCAINE HCL (PF) 1 % IJ SOLN
30.0000 mL | Freq: Once | INTRAMUSCULAR | Status: DC
  Filled 2014-08-06: qty 30

## 2014-08-06 MED ORDER — LIDOCAINE HCL (PF) 1 % IJ SOLN
INTRAMUSCULAR | Status: AC
  Filled 2014-08-06: qty 10

## 2014-08-06 MED ORDER — SULFAMETHOXAZOLE-TRIMETHOPRIM 800-160 MG PO TABS: 1.0000 | ORAL_TABLET | Freq: Two times a day (BID) | ORAL | Status: DC

## 2014-08-06 MED ORDER — MORPHINE SULFATE 4 MG/ML IJ SOLN
6.0000 mg | Freq: Once | INTRAMUSCULAR | Status: AC
  Administered 2014-08-06: 6 mg via INTRAVENOUS
  Filled 2014-08-06: qty 2

## 2014-08-06 MED ORDER — VANCOMYCIN HCL 10 G IV SOLR
1500.0000 mg | Freq: Once | INTRAVENOUS | Status: AC
  Administered 2014-08-06: 1500 mg via INTRAVENOUS
  Filled 2014-08-06: qty 1500

## 2014-08-06 MED ORDER — MORPHINE SULFATE 2 MG/ML IJ SOLN
6.0000 mg | Freq: Once | INTRAMUSCULAR | Status: AC
  Administered 2014-08-06: 6 mg via INTRAVENOUS
  Filled 2014-08-06: qty 3

## 2014-08-06 MED ORDER — DIPHENHYDRAMINE HCL 25 MG PO CAPS
25.0000 mg | ORAL_CAPSULE | Freq: Once | ORAL | Status: AC
  Administered 2014-08-06: 25 mg via ORAL
  Filled 2014-08-06: qty 1

## 2014-08-06 MED ORDER — LORAZEPAM 2 MG/ML IJ SOLN
0.5000 mg | Freq: Once | INTRAMUSCULAR | Status: AC
  Administered 2014-08-06: 0.5 mg via INTRAVENOUS
  Filled 2014-08-06: qty 1

## 2014-08-06 NOTE — ED Provider Notes (Signed)
CSN: [id number]     Arrival date & time 08/06/14  1223 History   First MD Initiated Contact with Patient 08/06/14 1301     Chief Complaint  Patient presents with  . Wound Check     (Consider location/radiation/quality/duration/timing/severity/associated sxs/prior Treatment) HPI Comments: 48 year old female with history of high blood pressure, cholesterol, brain tumor on chronic narcotics for pain presents for wound check of right breast. Patient was seen at urgent care and had I&D of right lower breast and has been taking clindamycin and Keflex since. Patient has had persistent pain has not improved, patient does feel is continuing to drain. Patient has had spreading redness surrounding the area. No fevers or chills. No vomiting. Pain to palpation and movement.  Patient is a 48 y.o. female presenting with wound check. The history is provided by the patient.  Wound Check Pertinent negatives include no chest pain, no abdominal pain, no headaches and no shortness of breath.    Past Medical History  Diagnosis Date  . Diverticulitis   . Depression   . Hypertension   . High cholesterol   . Benign tumor of brain    Past Surgical History  Procedure Laterality Date  . Brain tumor excision    . Tubal ligation    . Abdominal hysterectomy    . Cholecystectomy    . Endometrial ablation    . Carpal tunnel release     Family History  Problem Relation Age of Onset  . Hypertension Mother   . Hyperlipidemia Mother   . Hypertension Father   . Hyperlipidemia Father   . Heart failure Father    History  Substance Use Topics  . Smoking status: Never Smoker   . Smokeless tobacco: Not on file  . Alcohol Use: No   OB History   Grav Para Term Preterm Abortions TAB SAB Ect Mult Living   2 2             Review of Systems  Constitutional: Positive for appetite change. Negative for fever and chills.  HENT: Negative for congestion.   Eyes: Negative for visual disturbance.  Respiratory:  Negative for shortness of breath.   Cardiovascular: Negative for chest pain.  Gastrointestinal: Negative for vomiting and abdominal pain.  Genitourinary: Negative for dysuria and flank pain.  Musculoskeletal: Negative for back pain, neck pain and neck stiffness.  Skin: Positive for rash.  Neurological: Negative for light-headedness and headaches.      Allergies  Dilaudid  Home Medications   Prior to Admission medications   Medication Sig Start Date End Date Taking? Authorizing Provider  alprazolam Duanne Moron) 2 MG tablet Take 2 mg by mouth 4 (four) times daily as needed for anxiety.    Yes Historical Provider, MD  cephALEXin (KEFLEX) 500 MG capsule Take 500 mg by mouth 2 (two) times daily. Starting 08/02/2014 x 10 days.   Yes Historical Provider, MD  clindamycin (CLEOCIN) 300 MG capsule Take 300 mg by mouth every 8 (eight) hours. Starting 08/02/2014 x 10 days.   Yes Historical Provider, MD  hydrochlorothiazide (HYDRODIURIL) 25 MG tablet Take 25 mg by mouth at bedtime.   Yes Historical Provider, MD  Oxycodone HCl 20 MG TABS Take 1 tablet by mouth every 6 (six) hours as needed (pain from brain surgery).   Yes Historical Provider, MD  predniSONE (DELTASONE) 10 MG tablet Take 2 tablets (20 mg total) by mouth daily. 12/10/13  Yes Shaune Pollack, MD  simvastatin (ZOCOR) 20 MG tablet Take 20 mg by  mouth daily at 6 PM.   Yes Historical Provider, MD  sulfamethoxazole-trimethoprim (SEPTRA DS) 800-160 MG per tablet Take 1 tablet by mouth every 12 (twelve) hours. 08/06/14   Mariea Clonts, MD   BP 118/86  Pulse 90  Temp(Src) 98.4 F (36.9 C) (Oral)  Resp 18  Ht 5\' 6"  (1.676 m)  Wt 220 lb (99.791 kg)  BMI 35.53 kg/m2  SpO2 94% Physical Exam  Nursing note and vitals reviewed. Constitutional: She is oriented to person, place, and time. She appears well-developed and well-nourished.  HENT:  Head: Normocephalic and atraumatic.  Eyes: Right eye exhibits no discharge. Left eye exhibits no discharge.    Neck: Normal range of motion. Neck supple. No tracheal deviation present.  Cardiovascular: Normal rate and regular rhythm.   Pulmonary/Chest: Effort normal and breath sounds normal.  Abdominal: Soft. She exhibits no distension. There is no tenderness. There is no guarding.  Musculoskeletal: She exhibits tenderness. She exhibits no edema.  Neurological: She is alert and oriented to person, place, and time.  Skin: Skin is warm. Rash noted. There is erythema.     Patient with approximately 10 cm of warmth and erythema inferior to right nipple. Packing removed and mild drainage. No significant induration. No crepitus. Mild tender to touch.  Psychiatric: She has a normal mood and affect.    ED Course  Procedures (including critical care time) EMERGENCY DEPARTMENT US SOFT TISSUE INTERPRETATION "Study: Limited Soft Tissue Ultrasound"3  INDICATIONS: Pain and Soft tissue infection Multiple views of the body part were obtained in real-time with a multi-frequency linear probe PERFORMED BY:  Myself IMAGES ARCHIVED?: Yes SIDE:Right  BODY PART:Breast FINDINGS: Cellulitis present INTERPRETATION:  Cellulitis present Very small fluid collection   Labs Review Labs Reviewed  BASIC METABOLIC PANEL  CBC WITH DIFFERENTIAL  I-STAT CG4 LACTIC ACID, ED    Imaging Review No results found.   EKG Interpretation None      MDM   Final diagnoses:  Cellulitis of female breast  Abscess of right breast   Patient with cellulitis spreading from recently drained abscess. Bedside ultrasound showed very small fluid collection however significant pain to cellulitic component. Patient has no systemic symptoms, vitals unremarkable and blood work unremarkable. Vancomycin dose given in the ER with plan for close followup and reassessment either in the ER over the weekend or general surgery/PCP on Monday. Pain medicines given patient improved on recheck. Plan for change clindamycin to Bactrim.  Results and  differential diagnosis were discussed with the patient/parent/guardian. Close follow up outpatient was discussed, comfortable with the plan.   Medications  lidocaine (PF) (XYLOCAINE) 1 % injection 30 mL (30 mLs Intradermal Not Given 08/06/14 1617)  diphenhydrAMINE (BENADRYL) capsule 25 mg (not administered)  morphine 4 MG/ML injection 6 mg (6 mg Intravenous Given 08/06/14 1403)  vancomycin (VANCOCIN) 1,500 mg in sodium chloride 0.9 % 500 mL IVPB (1,500 mg Intravenous New Bag/Given 08/06/14 1414)  LORazepam (ATIVAN) injection 0.5 mg (0.5 mg Intravenous Given 08/06/14 1454)  morphine 2 MG/ML injection 6 mg (6 mg Intravenous Given 08/06/14 1545)    Filed Vitals:   08/06/14 1401 08/06/14 1430 08/06/14 1530 08/06/14 1600  BP: 129/85 135/89 138/78 118/86  Pulse: 75 75 80 90  Temp:      TempSrc:      Resp:    18  Height:      Weight:      SpO2: 97% 95% 96% 94%   Diagnosis:       Mariea Clonts,  MD 08/06/14 2222

## 2014-08-06 NOTE — ED Notes (Signed)
Pt had I and D of abscess at Urgent Care,  And taking antibiotic,  Told to come here for eval due to not  Getting better.

## 2014-08-06 NOTE — Discharge Instructions (Signed)
Stop taking clindamycin. Continue Keflex and start Bactrim antibiotic.  Take her pain meds as previous and continue soaking wound in the bath. See a surgeon and primary care Dr. on Monday for recheck.  If you were given medicines take as directed.  If you are on coumadin or contraceptives realize their levels and effectiveness is altered by many different medicines.  If you have any reaction (rash, tongues swelling, other) to the medicines stop taking and see a physician.   Please follow up as directed and return to the ER or see a physician for new or worsening symptoms.  Thank you. Filed Vitals:   08/06/14 1232 08/06/14 1401 08/06/14 1430 08/06/14 1530  BP: 134/85 129/85 135/89 138/78  Pulse: 84 75 75 80  Temp: 98.4 F (36.9 C)     TempSrc: Oral     Resp: 16     Height: 5\' 6"  (1.676 m)     Weight: 220 lb (99.791 kg)     SpO2: 98% 97% 95% 96%    Cellulitis Cellulitis is an infection of the skin and the tissue under the skin. The infected area is usually red and tender. This happens most often in the arms and lower legs. HOME CARE   Take your antibiotic medicine as told. Finish the medicine even if you start to feel better.  Keep the infected arm or leg raised (elevated).  Put a warm cloth on the area up to 4 times per day.  Only take medicines as told by your doctor.  Keep all doctor visits as told. GET HELP IF:  You see red streaks on the skin coming from the infected area.  Your red area gets bigger or turns a dark color.  Your bone or joint under the infected area is painful after the skin heals.  Your infection comes back in the same area or different area.  You have a puffy (swollen) bump in the infected area.  You have new symptoms.  You have a fever. GET HELP RIGHT AWAY IF:   You feel very sleepy.  You throw up (vomit) or have watery poop (diarrhea).  You feel sick and have muscle aches and pains. MAKE SURE YOU:   Understand these instructions.  Will  watch your condition.  Will get help right away if you are not doing well or get worse. Document Released: 05/14/2008 Document Revised: 04/12/2014 Document Reviewed: 02/11/2012 [facility] Patient Information 2015 [place], [place]. This information is not intended to replace advice given to you by your health care provider. Make sure you discuss any questions you have with your health care provider.

## 2014-08-06 NOTE — ED Notes (Signed)
Pt is waiting for antibiotic to finish prior to being able to be discharged.  Pt provided with water to drink while waiting.

## 2014-08-06 NOTE — ED Notes (Signed)
Pt reports that she is having a panic attack with pain radiating behind back.  MD Zavitz made aware.

## 2014-08-06 NOTE — Care Management Note (Signed)
ED/CM noted patient did not have health insurance and/or PCP listed in the computer.  Patient was given the [place] resource handout with information on the clinics, food pantries, and the handout for new health insurance sign-up.  Patient expressed appreciation for information received. 

## 2014-08-06 NOTE — ED Notes (Signed)
MD Zavitz at bedside with ultrasound machine.

## 2014-09-06 ENCOUNTER — Emergency Department (HOSPITAL_COMMUNITY): Payer: Medicaid Other

## 2014-09-06 ENCOUNTER — Encounter (HOSPITAL_COMMUNITY): Payer: Self-pay | Admitting: Emergency Medicine

## 2014-09-06 ENCOUNTER — Emergency Department (HOSPITAL_COMMUNITY)
Admission: EM | Admit: 2014-09-06 | Discharge: 2014-09-06 | Disposition: A | Discharge status: Home / Self Care | Payer: Medicaid Other | Attending: Emergency Medicine | Admitting: Emergency Medicine

## 2014-09-06 DIAGNOSIS — IMO0002 Reserved for concepts with insufficient information to code with codable children: Secondary | ICD-10-CM | POA: Diagnosis not present

## 2014-09-06 DIAGNOSIS — F3289 Other specified depressive episodes: Secondary | ICD-10-CM | POA: Insufficient documentation

## 2014-09-06 DIAGNOSIS — Z8669 Personal history of other diseases of the nervous system and sense organs: Secondary | ICD-10-CM | POA: Diagnosis not present

## 2014-09-06 DIAGNOSIS — I10 Essential (primary) hypertension: Secondary | ICD-10-CM | POA: Insufficient documentation

## 2014-09-06 DIAGNOSIS — R51 Headache: Secondary | ICD-10-CM | POA: Diagnosis not present

## 2014-09-06 DIAGNOSIS — R079 Chest pain, unspecified: Secondary | ICD-10-CM | POA: Diagnosis not present

## 2014-09-06 DIAGNOSIS — Z9889 Other specified postprocedural states: Secondary | ICD-10-CM | POA: Insufficient documentation

## 2014-09-06 DIAGNOSIS — Z79899 Other long term (current) drug therapy: Secondary | ICD-10-CM | POA: Diagnosis not present

## 2014-09-06 DIAGNOSIS — H5710 Ocular pain, unspecified eye: Secondary | ICD-10-CM | POA: Diagnosis present

## 2014-09-06 DIAGNOSIS — Z8719 Personal history of other diseases of the digestive system: Secondary | ICD-10-CM | POA: Insufficient documentation

## 2014-09-06 DIAGNOSIS — L723 Sebaceous cyst: Secondary | ICD-10-CM | POA: Diagnosis not present

## 2014-09-06 DIAGNOSIS — Z792 Long term (current) use of antibiotics: Secondary | ICD-10-CM | POA: Insufficient documentation

## 2014-09-06 DIAGNOSIS — L729 Follicular cyst of the skin and subcutaneous tissue, unspecified: Secondary | ICD-10-CM

## 2014-09-06 DIAGNOSIS — F329 Major depressive disorder, single episode, unspecified: Secondary | ICD-10-CM | POA: Diagnosis not present

## 2014-09-06 DIAGNOSIS — R519 Headache, unspecified: Secondary | ICD-10-CM

## 2014-09-06 DIAGNOSIS — R110 Nausea: Secondary | ICD-10-CM | POA: Insufficient documentation

## 2014-09-06 DIAGNOSIS — E785 Hyperlipidemia, unspecified: Secondary | ICD-10-CM | POA: Diagnosis not present

## 2014-09-06 MED ORDER — DOXYCYCLINE HYCLATE 100 MG PO CAPS: 100.0000 mg | ORAL_CAPSULE | Freq: Two times a day (BID) | ORAL | Status: DC

## 2014-09-06 MED ORDER — NAPROXEN 500 MG PO TABS: 500.0000 mg | ORAL_TABLET | Freq: Two times a day (BID) | ORAL | Status: DC

## 2014-09-06 NOTE — Discharge Instructions (Signed)
Take medications as directed. Followup with your primary care Dr. to see if they want to repeat the MRI that was done a year ago. Return for any newer worse symptoms. Take antibiotic for the skin cyst.If it gets worse return.

## 2014-09-06 NOTE — ED Notes (Signed)
Pt has had brain surgery 2 year ago, removed a tumor, pt said symptoms started with pressure behind eyes, She is having that feeling again. Pt is also having shooting pain in right breast. Pt had abscess lanced last month, area is red, and painful.

## 2014-09-06 NOTE — ED Provider Notes (Signed)
CSN: [id number]     Arrival date & time 09/06/14  1624 History   First MD Initiated Contact with Patient 09/06/14 1916     Chief Complaint  Patient presents with  . Pressure Behind the Eyes  . Abscess     (Consider location/radiation/quality/duration/timing/severity/associated sxs/prior Treatment) Patient is a 48 y.o. female presenting with abscess. The history is provided by the patient.  Abscess Associated symptoms: headaches and nausea   Associated symptoms: no fever and no vomiting    patient status post removal of benign brain tumor 2 years ago. Patient had an MRI one year ago without a significant findings. Patient's had some discomfort since the time of the tumor removal. In the last 4 days that discomfort behind her eyes predominantly the right eye has gotten worse. No visual changes. In addition patient had an I&D of her breast or skin abscess last month. Patient feels it that's getting red and painful again.  Past Medical History  Diagnosis Date  . Diverticulitis   . Depression   . Hypertension   . High cholesterol   . Benign tumor of brain    Past Surgical History  Procedure Laterality Date  . Brain tumor excision    . Tubal ligation    . Abdominal hysterectomy    . Cholecystectomy    . Endometrial ablation    . Carpal tunnel release     Family History  Problem Relation Age of Onset  . Hypertension Mother   . Hyperlipidemia Mother   . Hypertension Father   . Hyperlipidemia Father   . Heart failure Father    History  Substance Use Topics  . Smoking status: Never Smoker   . Smokeless tobacco: Not on file  . Alcohol Use: No   OB History   Grav Para Term Preterm Abortions TAB SAB Ect Mult Living   2 2             Review of Systems  Constitutional: Negative for fever.  HENT: Negative for congestion.   Eyes: Negative for visual disturbance.  Respiratory: Negative for shortness of breath.   Cardiovascular: Positive for chest pain.  Gastrointestinal:  Positive for nausea. Negative for vomiting.  Musculoskeletal: Negative for neck pain.  Skin: Negative for rash.  Neurological: Positive for headaches.  Hematological: Does not bruise/bleed easily.  Psychiatric/Behavioral: Negative for confusion.      Allergies  Dilaudid  Home Medications   Prior to Admission medications   Medication Sig Start Date End Date Taking? Authorizing Provider  alprazolam Duanne Moron) 2 MG tablet Take 2 mg by mouth 4 (four) times daily as needed for anxiety.     Historical Provider, MD  cephALEXin (KEFLEX) 500 MG capsule Take 500 mg by mouth 2 (two) times daily. Starting 08/02/2014 x 10 days.    Historical Provider, MD  clindamycin (CLEOCIN) 300 MG capsule Take 300 mg by mouth every 8 (eight) hours. Starting 08/02/2014 x 10 days.    Historical Provider, MD  doxycycline (VIBRAMYCIN) 100 MG capsule Take 1 capsule (100 mg total) by mouth 2 (two) times daily. 09/06/14   Fredia Sorrow, MD  hydrochlorothiazide (HYDRODIURIL) 25 MG tablet Take 25 mg by mouth at bedtime.    Historical Provider, MD  naproxen (NAPROSYN) 500 MG tablet Take 1 tablet (500 mg total) by mouth 2 (two) times daily. 09/06/14   Fredia Sorrow, MD  Oxycodone HCl 20 MG TABS Take 1 tablet by mouth every 6 (six) hours as needed (pain from brain surgery).  Historical Provider, MD  predniSONE (DELTASONE) 10 MG tablet Take 2 tablets (20 mg total) by mouth daily. 12/10/13   Shaune Pollack, MD  simvastatin (ZOCOR) 20 MG tablet Take 20 mg by mouth daily at 6 PM.    Historical Provider, MD  sulfamethoxazole-trimethoprim (SEPTRA DS) 800-160 MG per tablet Take 1 tablet by mouth every 12 (twelve) hours. 08/06/14   Mariea Clonts, MD   BP 147/90  Pulse 86  Temp(Src) 98.6 F (37 C) (Oral)  Resp 19  Ht 5\' 6"  (1.676 m)  Wt 220 lb (99.791 kg)  BMI 35.53 kg/m2  SpO2 100% Physical Exam  Nursing note and vitals reviewed. Constitutional: She is oriented to person, place, and time. She appears well-developed and  well-nourished. No distress.  HENT:  Head: Normocephalic and atraumatic.  Eyes: Conjunctivae and EOM are normal. Pupils are equal, round, and reactive to light.  Neck: Normal range of motion. Neck supple.  Cardiovascular: Normal rate and normal heart sounds.   No murmur heard. Pulmonary/Chest: Effort normal and breath sounds normal. She exhibits tenderness.  Area under her right breast old I&D site. Slight erythema no induration no fluctuance. No hard mass.  Abdominal: Soft. Bowel sounds are normal. She exhibits no distension.  Musculoskeletal: Normal range of motion.  Neurological: She is alert and oriented to person, place, and time. No cranial nerve deficit. She exhibits normal muscle tone. Coordination normal.  Skin: Skin is warm. No rash noted.    ED Course  Procedures (including critical care time) Labs Review Labs Reviewed - No data to display  Imaging Review Ct Head Wo Contrast  09/06/2014   CLINICAL DATA:  Right eye pain/pressure  EXAM: CT HEAD WITHOUT CONTRAST  TECHNIQUE: Contiguous axial images were obtained from the base of the skull through the vertex without intravenous contrast.  COMPARISON:  03/08/2012  FINDINGS: No evidence of parenchymal hemorrhage or extra-axial fluid collection. No mass lesion, mass effect, or midline shift.  No CT evidence of acute infarction.  Cerebral volume is within normal limits.  No ventriculomegaly.  The visualized paranasal sinuses are essentially clear. The mastoid air cells are unopacified.  No evidence of calvarial fracture.  IMPRESSION: Normal head CT.   Electronically Signed   By: Julian Hy M.D.   On: 09/06/2014 19:37     EKG Interpretation None      MDM   Final diagnoses:  Headache around the eyes  Skin cyst    Head CT without any significant abnormalities. Follow up with your primary care Dr. to see if they want to repeat the MRI that was done a year ago. A skin cyst right at the margin of the right breast without  evidence of acute infection however since you feel increased tenderness in that area. Currently nothing to I&D. The patient's brain tumor had passed was clearly benign. Patient has had an MRI in the last year. Today's head CT negative.    Fredia Sorrow, MD 09/06/14 2010

## 2014-09-13 ENCOUNTER — Ambulatory Visit: Payer: Medicaid Other | Admitting: Obstetrics & Gynecology

## 2014-10-11 ENCOUNTER — Encounter (HOSPITAL_COMMUNITY): Payer: Self-pay | Admitting: Emergency Medicine

## 2014-11-15 ENCOUNTER — Ambulatory Visit: Payer: Medicaid Other | Admitting: Obstetrics & Gynecology

## 2015-01-10 ENCOUNTER — Ambulatory Visit: Payer: Medicaid Other | Admitting: Obstetrics & Gynecology

## 2016-03-31 ENCOUNTER — Encounter (HOSPITAL_COMMUNITY): Payer: Self-pay | Admitting: *Deleted

## 2016-03-31 ENCOUNTER — Emergency Department (HOSPITAL_COMMUNITY)
Admission: EM | Admit: 2016-03-31 | Discharge: 2016-03-31 | Disposition: A | Discharge status: Home / Self Care | Payer: Medicaid Other | Attending: Emergency Medicine | Admitting: Emergency Medicine

## 2016-03-31 DIAGNOSIS — F329 Major depressive disorder, single episode, unspecified: Secondary | ICD-10-CM | POA: Diagnosis not present

## 2016-03-31 DIAGNOSIS — Z79899 Other long term (current) drug therapy: Secondary | ICD-10-CM | POA: Insufficient documentation

## 2016-03-31 DIAGNOSIS — N390 Urinary tract infection, site not specified: Secondary | ICD-10-CM | POA: Diagnosis not present

## 2016-03-31 DIAGNOSIS — I10 Essential (primary) hypertension: Secondary | ICD-10-CM | POA: Insufficient documentation

## 2016-03-31 DIAGNOSIS — R300 Dysuria: Secondary | ICD-10-CM | POA: Diagnosis present

## 2016-03-31 LAB — URINALYSIS, ROUTINE W REFLEX MICROSCOPIC
Bilirubin Urine: NEGATIVE
Glucose, UA: NEGATIVE mg/dL
Ketones, ur: NEGATIVE mg/dL
Nitrite: POSITIVE — AB
PROTEIN: NEGATIVE mg/dL
Specific Gravity, Urine: <1.005 — ABNORMAL LOW (ref 1.005–1.030)
pH: 5.5 (ref 5.0–8.0)

## 2016-03-31 LAB — URINE MICROSCOPIC-ADD ON

## 2016-03-31 MED ORDER — CEPHALEXIN 500 MG PO CAPS
500.0000 mg | ORAL_CAPSULE | Freq: Once | ORAL | Status: AC
  Administered 2016-03-31: 500 mg via ORAL
  Filled 2016-03-31: qty 1

## 2016-03-31 MED ORDER — CEPHALEXIN 500 MG PO CAPS: 500.0000 mg | ORAL_CAPSULE | Freq: Two times a day (BID) | ORAL | Status: DC

## 2016-03-31 MED ORDER — PHENAZOPYRIDINE HCL 100 MG PO TABS: 100.0000 mg | ORAL_TABLET | Freq: Three times a day (TID) | ORAL | Status: DC | PRN

## 2016-03-31 MED ORDER — PHENAZOPYRIDINE HCL 100 MG PO TABS
100.0000 mg | ORAL_TABLET | Freq: Once | ORAL | Status: AC
  Administered 2016-03-31: 100 mg via ORAL
  Filled 2016-03-31: qty 1

## 2016-03-31 NOTE — ED Notes (Signed)
Pt states she has pain when when urinates & increased frequency that started 2 hours ago.

## 2016-03-31 NOTE — Discharge Instructions (Signed)

## 2016-03-31 NOTE — ED Provider Notes (Signed)
CSN: [id number]     Arrival date & time 03/31/16  0026 History   First MD Initiated Contact with Patient 03/31/16 0044     Chief Complaint  Patient presents with  . Urinary Tract Infection     (Consider location/radiation/quality/duration/timing/severity/associated sxs/prior Treatment) HPI  This is a 50 year old female who presents with a 2-3 hour history of urinary urgency, frequency, and dysuria. States she felt well earlier today. Denies fever or back pain. She reports spasming over her lower abdomen. She states that she feels like she has to PE but "only a little is coming out." Denies any vaginal discharge or concern for STDs.  Past Medical History  Diagnosis Date  . Diverticulitis   . Depression   . Hypertension   . High cholesterol   . Benign tumor of brain [facility])    Past Surgical History  Procedure Laterality Date  . Brain tumor excision    . Tubal ligation    . Abdominal hysterectomy    . Cholecystectomy    . Endometrial ablation    . Carpal tunnel release     Family History  Problem Relation Age of Onset  . Hypertension Mother   . Hyperlipidemia Mother   . Hypertension Father   . Hyperlipidemia Father   . Heart failure Father    Social History  Substance Use Topics  . Smoking status: Never Smoker   . Smokeless tobacco: None  . Alcohol Use: No   OB History    Gravida Para Term Preterm AB TAB SAB Ectopic Multiple Living   2 2             Review of Systems  Constitutional: Negative for fever.  Genitourinary: Positive for dysuria, urgency, frequency and decreased urine volume. Negative for flank pain and vaginal discharge.  Musculoskeletal: Negative for back pain.      Allergies  Dilaudid  Home Medications   Prior to Admission medications   Medication Sig Start Date End Date Taking? Authorizing Provider  alprazolam Duanne Moron) 2 MG tablet Take 2 mg by mouth 4 (four) times daily as needed for anxiety.    Yes Historical Provider, MD  amoxicillin  (AMOXIL) 500 MG tablet Take 500 mg by mouth 2 (two) times daily.   Yes Historical Provider, MD  hydrochlorothiazide (HYDRODIURIL) 25 MG tablet Take 25 mg by mouth at bedtime.   Yes Historical Provider, MD  Oxycodone HCl 20 MG TABS Take 1 tablet by mouth every 6 (six) hours as needed (pain from brain surgery).   Yes Historical Provider, MD  sertraline (ZOLOFT) 100 MG tablet Take 100 mg by mouth 2 (two) times daily.   Yes Historical Provider, MD  simvastatin (ZOCOR) 20 MG tablet Take 20 mg by mouth daily at 6 PM.   Yes Historical Provider, MD  cephALEXin (KEFLEX) 500 MG capsule Take 1 capsule (500 mg total) by mouth 2 (two) times daily. 03/31/16   Merryl Hacker, MD  clindamycin (CLEOCIN) 300 MG capsule Take 300 mg by mouth every 8 (eight) hours. Starting 08/02/2014 x 10 days.    Historical Provider, MD  doxycycline (VIBRAMYCIN) 100 MG capsule Take 1 capsule (100 mg total) by mouth 2 (two) times daily. 09/06/14   Fredia Sorrow, MD  naproxen (NAPROSYN) 500 MG tablet Take 1 tablet (500 mg total) by mouth 2 (two) times daily. 09/06/14   Fredia Sorrow, MD  predniSONE (DELTASONE) 10 MG tablet Take 2 tablets (20 mg total) by mouth daily. 12/10/13   Pattricia Boss, MD  sulfamethoxazole-trimethoprim (  SEPTRA DS) 800-160 MG per tablet Take 1 tablet by mouth every 12 (twelve) hours. 08/06/14   Elnora Morrison, MD   BP 143/99 mmHg  Pulse 110  Temp(Src) 97.8 F (36.6 C) (Oral)  Resp 20  Ht 5\' 6"  (1.676 m)  Wt 224 lb (101.606 kg)  BMI 36.17 kg/m2  SpO2 100% Physical Exam  Constitutional: She is oriented to person, place, and time. She appears well-developed and well-nourished. No distress.  HENT:  Head: Normocephalic and atraumatic.  Cardiovascular: Normal rate and regular rhythm.   Pulmonary/Chest: Effort normal. No respiratory distress.  Abdominal: There is no tenderness.  Neurological: She is alert and oriented to person, place, and time.  Skin: Skin is warm and dry.  Psychiatric: She has a normal mood  and affect.  Nursing note and vitals reviewed.   ED Course  Procedures (including critical care time) Labs Review Labs Reviewed  URINALYSIS, ROUTINE W REFLEX MICROSCOPIC (NOT AT [facility]) - Abnormal; Notable for the following:    Specific Gravity, Urine <1.005 (*)    Hgb urine dipstick LARGE (*)    Nitrite POSITIVE (*)    Leukocytes, UA SMALL (*)    All other components within normal limits  URINE MICROSCOPIC-ADD ON - Abnormal; Notable for the following:    Squamous Epithelial / LPF 0-5 (*)    Bacteria, UA FEW (*)    All other components within normal limits  URINE CULTURE    Imaging Review No results found. I have personally reviewed and evaluated these images and lab results as part of my medical decision-making.   EKG Interpretation None      MDM   Final diagnoses:  UTI (lower urinary tract infection)    Patient presents with several hours of dysuria and urgency.  Nontoxic on exam. Afebrile. Urinalysis with nitrite positive urine and to numerous count white cells. Urine culture sent. Patient was given Pyridium and Keflex. Suspect simple UTI. No signs or symptoms of pyelonephritis.  After history, exam, and medical workup I feel the patient has been appropriately medically screened and is safe for discharge home. Pertinent diagnoses were discussed with the patient. Patient was given return precautions.     Merryl Hacker, MD 03/31/16 [phone]

## 2016-04-03 LAB — URINE CULTURE: Culture: 80000 — AB

## 2016-04-04 ENCOUNTER — Telehealth: Payer: Self-pay | Admitting: *Deleted

## 2016-04-04 NOTE — ED Notes (Signed)
Post ED Visit - Positive Culture Follow-up  Culture report reviewed by antimicrobial stewardship pharmacist:  []  Elenor Quinones, Pharm.D. [x]  Heide Guile, Pharm.D., BCPS []  Parks Neptune, Pharm.D. []  Alycia Rossetti, Pharm.D., BCPS []  [place], Pharm.D., BCPS, AAHIVP []  Legrand Como, Pharm.D., BCPS, AAHIVP []  Cassie Stewart, Pharm.D. []  Stephens November, Pharm.D.  Positive urine culture Treated with Cephalexin, organism sensitive to the same and no further patient follow-up is required at this time.  Harlon Flor [facility] 04/04/2016, 11:04 AM

## 2019-07-09 NOTE — Progress Notes (Signed)
Just with Dr. Rich Fuchs office and they canceling this surgery and moving it to a later time in September.

## 2019-07-13 ENCOUNTER — Other Ambulatory Visit (HOSPITAL_COMMUNITY): Admission: RE | Admit: 2019-07-13 | Payer: Medicaid Other | Source: Ambulatory Visit

## 2019-08-31 ENCOUNTER — Other Ambulatory Visit (HOSPITAL_COMMUNITY): Admission: RE | Admit: 2019-08-31 | Payer: Medicaid Other | Source: Ambulatory Visit

## 2019-09-03 ENCOUNTER — Ambulatory Visit (HOSPITAL_BASED_OUTPATIENT_CLINIC_OR_DEPARTMENT_OTHER): Admit: 2019-09-03 | Payer: Medicaid Other | Admitting: Orthopaedic Surgery

## 2019-09-03 ENCOUNTER — Encounter (HOSPITAL_BASED_OUTPATIENT_CLINIC_OR_DEPARTMENT_OTHER): Payer: Self-pay

## 2019-09-03 SURGERY — SHOULDER ARTHROSCOPY WITH DEBRIDEMENT AND BICEP TENDON REPAIR
Anesthesia: Choice | Laterality: Left

## 2020-04-07 ENCOUNTER — Ambulatory Visit (HOSPITAL_BASED_OUTPATIENT_CLINIC_OR_DEPARTMENT_OTHER): Admission: RE | Admit: 2020-04-07 | Payer: Medicaid Other | Source: Home / Self Care | Admitting: Orthopaedic Surgery

## 2020-04-07 ENCOUNTER — Encounter (HOSPITAL_BASED_OUTPATIENT_CLINIC_OR_DEPARTMENT_OTHER): Admission: RE | Payer: Self-pay | Source: Home / Self Care

## 2020-04-07 SURGERY — SHOULDER ARTHROSCOPY WITH SUBACROMIAL DECOMPRESSION, ROTATOR CUFF REPAIR AND BICEP TENDON REPAIR
Anesthesia: Choice | Laterality: Left

## 2020-06-29 ENCOUNTER — Encounter (HOSPITAL_BASED_OUTPATIENT_CLINIC_OR_DEPARTMENT_OTHER): Payer: Self-pay | Admitting: Orthopaedic Surgery

## 2020-07-01 ENCOUNTER — Other Ambulatory Visit: Payer: Self-pay | Admitting: Sports Medicine

## 2020-07-01 DIAGNOSIS — R222 Localized swelling, mass and lump, trunk: Secondary | ICD-10-CM

## 2020-07-04 ENCOUNTER — Other Ambulatory Visit (HOSPITAL_COMMUNITY): Payer: Medicaid Other | Attending: Orthopaedic Surgery

## 2020-07-07 ENCOUNTER — Ambulatory Visit (HOSPITAL_BASED_OUTPATIENT_CLINIC_OR_DEPARTMENT_OTHER): Admission: RE | Admit: 2020-07-07 | Payer: Medicaid Other | Source: Home / Self Care | Admitting: Orthopaedic Surgery

## 2020-07-07 ENCOUNTER — Encounter (HOSPITAL_BASED_OUTPATIENT_CLINIC_OR_DEPARTMENT_OTHER): Admission: RE | Payer: Self-pay | Source: Home / Self Care

## 2020-07-07 SURGERY — SHOULDER ARTHROSCOPY WITH SUBACROMIAL DECOMPRESSION, ROTATOR CUFF REPAIR AND BICEP TENDON REPAIR
Anesthesia: Choice | Laterality: Left

## 2020-07-30 ENCOUNTER — Ambulatory Visit
Admission: RE | Admit: 2020-07-30 | Discharge: 2020-07-30 | Disposition: A | Discharge status: Home / Self Care | Payer: Medicaid Other | Source: Ambulatory Visit | Attending: Sports Medicine | Admitting: Sports Medicine

## 2020-07-30 ENCOUNTER — Other Ambulatory Visit: Payer: Self-pay

## 2020-07-30 DIAGNOSIS — R222 Localized swelling, mass and lump, trunk: Secondary | ICD-10-CM

## 2020-07-30 MED ORDER — GADOBENATE DIMEGLUMINE 529 MG/ML IV SOLN
20.0000 mL | Freq: Once | INTRAVENOUS | Status: AC | PRN
  Administered 2020-07-30: 20 mL via INTRAVENOUS

## 2020-09-08 ENCOUNTER — Ambulatory Visit (HOSPITAL_BASED_OUTPATIENT_CLINIC_OR_DEPARTMENT_OTHER): Admit: 2020-09-08 | Payer: Medicaid Other | Admitting: Orthopaedic Surgery

## 2020-09-08 ENCOUNTER — Encounter (HOSPITAL_BASED_OUTPATIENT_CLINIC_OR_DEPARTMENT_OTHER): Payer: Self-pay

## 2020-09-08 SURGERY — SHOULDER ARTHROSCOPY WITH DISTAL CLAVICLE RESECTION
Anesthesia: Choice | Site: Shoulder | Laterality: Left

## 2021-02-20 ENCOUNTER — Other Ambulatory Visit: Payer: Self-pay | Admitting: Neurosurgery

## 2021-02-20 DIAGNOSIS — M5416 Radiculopathy, lumbar region: Secondary | ICD-10-CM

## 2021-03-11 ENCOUNTER — Ambulatory Visit
Admission: RE | Admit: 2021-03-11 | Discharge: 2021-03-11 | Disposition: A | Discharge status: Home / Self Care | Payer: Medicaid Other | Source: Ambulatory Visit | Attending: Neurosurgery | Admitting: Neurosurgery

## 2021-03-11 ENCOUNTER — Other Ambulatory Visit: Payer: Self-pay

## 2021-03-11 DIAGNOSIS — M5416 Radiculopathy, lumbar region: Secondary | ICD-10-CM

## 2021-06-23 ENCOUNTER — Other Ambulatory Visit: Payer: Self-pay

## 2021-06-23 ENCOUNTER — Encounter (HOSPITAL_BASED_OUTPATIENT_CLINIC_OR_DEPARTMENT_OTHER): Payer: Self-pay | Admitting: Orthopaedic Surgery

## 2021-06-28 ENCOUNTER — Other Ambulatory Visit: Payer: Self-pay

## 2021-06-28 ENCOUNTER — Encounter (HOSPITAL_BASED_OUTPATIENT_CLINIC_OR_DEPARTMENT_OTHER)
Admission: RE | Admit: 2021-06-28 | Discharge: 2021-06-28 | Disposition: A | Discharge status: Home / Self Care | Payer: Medicaid Other | Source: Ambulatory Visit | Attending: Orthopaedic Surgery | Admitting: Orthopaedic Surgery

## 2021-06-28 DIAGNOSIS — M19012 Primary osteoarthritis, left shoulder: Secondary | ICD-10-CM | POA: Diagnosis present

## 2021-06-28 DIAGNOSIS — K219 Gastro-esophageal reflux disease without esophagitis: Secondary | ICD-10-CM | POA: Diagnosis not present

## 2021-06-28 DIAGNOSIS — Z01818 Encounter for other preprocedural examination: Secondary | ICD-10-CM | POA: Diagnosis not present

## 2021-06-28 DIAGNOSIS — M75102 Unspecified rotator cuff tear or rupture of left shoulder, not specified as traumatic: Secondary | ICD-10-CM | POA: Diagnosis not present

## 2021-06-28 DIAGNOSIS — I2510 Atherosclerotic heart disease of native coronary artery without angina pectoris: Secondary | ICD-10-CM | POA: Insufficient documentation

## 2021-06-28 DIAGNOSIS — Z885 Allergy status to narcotic agent status: Secondary | ICD-10-CM | POA: Diagnosis not present

## 2021-06-28 DIAGNOSIS — M7522 Bicipital tendinitis, left shoulder: Secondary | ICD-10-CM | POA: Diagnosis not present

## 2021-06-28 DIAGNOSIS — I10 Essential (primary) hypertension: Secondary | ICD-10-CM | POA: Insufficient documentation

## 2021-06-28 DIAGNOSIS — Z7982 Long term (current) use of aspirin: Secondary | ICD-10-CM | POA: Diagnosis not present

## 2021-06-28 DIAGNOSIS — Z79899 Other long term (current) drug therapy: Secondary | ICD-10-CM | POA: Diagnosis not present

## 2021-06-28 DIAGNOSIS — Z79890 Hormone replacement therapy (postmenopausal): Secondary | ICD-10-CM | POA: Diagnosis not present

## 2021-06-28 DIAGNOSIS — E7800 Pure hypercholesterolemia, unspecified: Secondary | ICD-10-CM | POA: Diagnosis not present

## 2021-06-28 DIAGNOSIS — M7542 Impingement syndrome of left shoulder: Secondary | ICD-10-CM | POA: Diagnosis not present

## 2021-06-28 LAB — BASIC METABOLIC PANEL
Anion gap: 6 (ref 5–15)
BUN: 15 mg/dL (ref 6–20)
CO2: 25 mmol/L (ref 22–32)
Calcium: 9 mg/dL (ref 8.9–10.3)
Chloride: 107 mmol/L (ref 98–111)
Creatinine, Ser: 0.64 mg/dL (ref 0.44–1.00)
GFR, Estimated: >60 mL/min (ref >60)
Glucose, Bld: 101 mg/dL — ABNORMAL HIGH (ref 70–99)
Potassium: 4.9 mmol/L (ref 3.5–5.1)
Sodium: 138 mmol/L (ref 135–145)

## 2021-06-28 NOTE — Progress Notes (Signed)

## 2021-06-28 NOTE — Progress Notes (Signed)
Patient here for pre op ekg. Per Dr. Christella Hartigan, request to have cardiology confirm this ekg today. Page sent out to Dr. Johnsie Cancel. Awaiting call back to request read now on ekg done today. Dr. Christella Hartigan also requested to reach out to Dr. Prince Rome and get cardiac clearance since recently seen 04/2021. Called office and asked to have chart reviewed for clearance, also faxed ekg from today.

## 2021-06-28 NOTE — H&P (Signed)
PREOPERATIVE H&P  Chief Complaint: LEFT SHOULDER CARTILEDGE DISORDER,OSTEOARTHRITIS,IMPINGEMENT SYNDROME,BICEP TENDONITIS,ROTATOR CUFF TEAR  HPI: [name] is a 55 y.o. female who is scheduled for, Procedure(s): SHOULDER ARTHROSCOPY WITH SUBACROMIAL DECOMPRESSION, ROTATOR CUFF REPAIR AND BICEP TENDON REPAIR IRRIGATION AND DEBRIDEMENT SHOULDER RESECTION DISTAL CLAVICAL.   Patient has a past medical history significant for GERD, HTN, HLD.   Patient has had left shoulder pain for some time. She has been scheduled for surgery, but has had to reschedule to do personal reasons. She has failed a trial of injections and physical therapy.   Her symptoms are rated as moderate to severe, and have been worsening.  This is significantly impairing activities of daily living.    Please see clinic note for further details on this patient's care.    She has elected for surgical management.   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[place])    Chronic back pain    Chronic, continuous use of opioids    Complication of anesthesia    wakes up during surgery   Coronary artery disease    non-obstr CAD-treat medically   Depression    Diverticulitis    Dyspnea    with exertion   GERD (gastroesophageal reflux disease)    High cholesterol    Hypertension    Past Surgical History:  Procedure Laterality Date   ABDOMINAL HYSTERECTOMY     BRAIN TUMOR EXCISION     CARPAL TUNNEL RELEASE     CHOLECYSTECTOMY     ENDOMETRIAL ABLATION     TUBAL LIGATION     Social History   Socioeconomic History   Marital status: Married    Spouse name: Not on file   Number of children: Not on file   Years of education: Not on file   Highest education level: Not on file  Occupational History   Not on file  Tobacco Use   Smoking status: Never   Smokeless tobacco: Never  Substance and Sexual Activity   Alcohol use: No   Drug use: No   Sexual activity: Not Currently     Birth control/protection: Surgical  Other Topics Concern   Not on file  Social History Narrative   Not on file   Social Determinants of Health   Financial Resource Strain: Not on file  Food Insecurity: Not on file  Transportation Needs: Not on file  Physical Activity: Not on file  Stress: Not on file  Social Connections: Not on file   Family History  Problem Relation Age of Onset   Hypertension Mother    Hyperlipidemia Mother    Hypertension Father    Hyperlipidemia Father    Heart failure Father    Allergies  Allergen Reactions   Dilaudid [Hydromorphone Hcl] Anxiety   Prior to Admission medications   Medication Sig Start Date End Date Taking? Authorizing Provider  aspirin EC 81 MG tablet Take 81 mg by mouth daily. Swallow whole.   Yes [provider]  atorvastatin (LIPITOR) 80 MG tablet Take 80 mg by mouth daily.   Yes [provider]  carvedilol (COREG) 12.5 MG tablet Take 12.5 mg by mouth 2 (two) times daily with a meal.   Yes [provider]  Cholecalciferol (VITAMIN D3) 1.25 MG (50000 UT) CAPS Take by mouth once a week.   Yes [provider]  esomeprazole (NEXIUM) 40 MG capsule Take 40 mg by mouth daily at 12 noon.   Yes [provider]  estradiol (ESTRACE) 1 MG tablet Take 1 mg by mouth daily.   Yes [provider]  icosapent Ethyl (VASCEPA) 1 g capsule Take 1 g by mouth 2 (two) times daily.   Yes [provider]  lisinopril-hydrochlorothiazide (ZESTORETIC) 10-12.5 MG tablet Take 1 tablet by mouth daily.   Yes [provider]  montelukast (SINGULAIR) 10 MG tablet Take 10 mg by mouth at bedtime.   Yes [provider]  Oxycodone HCl 20 MG TABS Take 1 tablet by mouth every 6 (six) hours as needed (pain from brain surgery).   Yes [provider]  sertraline (ZOLOFT) 100 MG tablet Take 100 mg by mouth 2 (two) times daily.   Yes [provider]    ROS: All other systems have  been reviewed and were otherwise negative with the exception of those mentioned in the HPI and as above.  Physical Exam: General: Alert, no acute distress Cardiovascular: No pedal edema Respiratory: No cyanosis, no use of accessory musculature GI: No organomegaly, abdomen is soft and non-tender Skin: No lesions in the area of chief complaint Neurologic: Sensation intact distally Psychiatric: Patient is competent for consent with normal mood and affect Lymphatic: No axillary or cervical lymphadenopathy  MUSCULOSKELETAL:  Left shoulder: On examination of the left shoulder there is active forward elevation to 150 degrees. External rotation to 60 degrees. She has 4/5 strength with supraspinatus and infraspinatus testing. Positive O'Brien's and positive impingement. Positive AC tenderness to palpation.   Imaging: Previous MRI demonstrates a leading edge supraspinatus tear.    Assessment: LEFT SHOULDER CARTILEDGE DISORDER,OSTEOARTHRITIS,IMPINGEMENT SYNDROME,BICEP TENDONITIS,ROTATOR CUFF TEAR  Plan: Plan for Procedure(s): SHOULDER ARTHROSCOPY WITH SUBACROMIAL DECOMPRESSION, ROTATOR CUFF REPAIR AND BICEP TENDON REPAIR IRRIGATION AND DEBRIDEMENT SHOULDER RESECTION DISTAL CLAVICAL  The risks benefits and alternatives were discussed with the patient including but not limited to the risks of nonoperative treatment, versus surgical intervention including infection, bleeding, nerve injury,  blood clots, cardiopulmonary complications, morbidity, mortality, among others, and they were willing to proceed.   The patient acknowledged the explanation, agreed to proceed with the plan and consent was signed.   She has received operative clearance from her PCP, DR. Joline Salt, and cardiologist, Dr. Prince Rome. Her PM&R provider has cleared her as well   Operative Plan: left shoulder arthroscopy with  subacromial decompression,  distal clavicle excision,. biceps tenodesis and rotator cuff repair.  Discharge  Medications: Standard DVT Prophylaxis: None Physical Therapy: Outpatient PT Special Discharge needs: Sling. IceMan   Ethelda Chick, PA-C  06/28/2021 9:05 AM

## 2021-06-28 NOTE — Anesthesia Preprocedure Evaluation (Addendum)
Anesthesia Evaluation  Patient identified by MRN, date of birth, ID band Patient awake    Reviewed: Allergy & Precautions, NPO status , Patient's Chart, lab work & pertinent test results, reviewed documented beta blocker date and time   History of Anesthesia Complications Negative for: history of anesthetic complications (per pt, every surgery they call her husband and tell him that she needed to be put to sleep again during surgery- pt has not had awareness)  Airway Mallampati: IV  TM Distance: >3 FB Neck ROM: Full   Comment: Could potentially be difficult airway- small chin Dental  (+) Teeth Intact, Missing, Dental Advisory Given,    Pulmonary shortness of breath and with exertion,    Pulmonary exam normal breath sounds clear to auscultation       Cardiovascular hypertension, Pt. on medications and Pt. on home beta blockers + CAD  Normal cardiovascular exam Rhythm:Regular Rate:Normal  Pt had an EKG done 06/28/21 in PAT which was read by machine as accelerated junctional rhythm. On my review EKG appeared similar to prior. The patient had a recent extensive cardiac workup including cath done with [facility] which showed nonobstructive coronary artery disease. The EKG was sent to her cardiologist who read the EKG as normal sinus rhythm and cleared her for surgery.   Neuro/Psych PSYCHIATRIC DISORDERS Anxiety Depression Multiple brain surgeries, last one 3 years ago- serial MRIs (last one last year)    GI/Hepatic Neg liver ROS, GERD  Medicated and Controlled,  Endo/Other  Morbid obesityBMI 40  Renal/GU negative Renal ROS     Musculoskeletal L shoulder OA   Abdominal (+) + obese,   Peds  Hematology negative hematology ROS (+)   Anesthesia Other Findings Chronic oxy 42m for back pain- 246mQID for many years   Reproductive/Obstetrics negative OB ROS                         Anesthesia Physical Anesthesia  Plan  ASA: 3  Anesthesia Plan: General and Regional   Post-op Pain Management: GA combined w/ Regional for post-op pain   Induction: Intravenous  PONV Risk Score and Plan: 3 and Ondansetron, Dexamethasone, Midazolam, Treatment may vary due to age or medical condition and Scopolamine patch - Pre-op  Airway Management Planned: Oral ETT  Additional Equipment: None  Intra-op Plan:   Post-operative Plan: Extubation in OR  Informed Consent: I have reviewed the patients History and Physical, chart, labs and discussed the procedure including the risks, benefits and alternatives for the proposed anesthesia with the patient or authorized representative who has indicated his/her understanding and acceptance.     Dental advisory given  Plan Discussed with: CRNA  Anesthesia Plan Comments: (Airway exam no reassuring, but has a documented airway note from 2013 at an outside hospital- grade 1 view with mac 3 blade)    Anesthesia Quick Evaluation

## 2021-06-28 NOTE — Progress Notes (Signed)
Clearance received from cardiology. Reviewed with Dr. Christella Hartigan, ok to proceed at [facility]

## 2021-06-29 ENCOUNTER — Ambulatory Visit (HOSPITAL_BASED_OUTPATIENT_CLINIC_OR_DEPARTMENT_OTHER): Payer: Medicaid Other | Admitting: Certified Registered"

## 2021-06-29 ENCOUNTER — Encounter (HOSPITAL_BASED_OUTPATIENT_CLINIC_OR_DEPARTMENT_OTHER)
Admission: RE | Disposition: A | Discharge status: Home / Self Care | Payer: Self-pay | Source: Home / Self Care | Attending: Orthopaedic Surgery

## 2021-06-29 ENCOUNTER — Ambulatory Visit (HOSPITAL_BASED_OUTPATIENT_CLINIC_OR_DEPARTMENT_OTHER)
Admission: RE | Admit: 2021-06-29 | Discharge: 2021-06-29 | Disposition: A | Discharge status: Home / Self Care | Payer: Medicaid Other | Attending: Orthopaedic Surgery | Admitting: Orthopaedic Surgery

## 2021-06-29 ENCOUNTER — Encounter (HOSPITAL_BASED_OUTPATIENT_CLINIC_OR_DEPARTMENT_OTHER): Payer: Self-pay | Admitting: Orthopaedic Surgery

## 2021-06-29 DIAGNOSIS — Z7982 Long term (current) use of aspirin: Secondary | ICD-10-CM | POA: Insufficient documentation

## 2021-06-29 DIAGNOSIS — M7542 Impingement syndrome of left shoulder: Secondary | ICD-10-CM | POA: Insufficient documentation

## 2021-06-29 DIAGNOSIS — M7522 Bicipital tendinitis, left shoulder: Secondary | ICD-10-CM | POA: Insufficient documentation

## 2021-06-29 DIAGNOSIS — Z885 Allergy status to narcotic agent status: Secondary | ICD-10-CM | POA: Insufficient documentation

## 2021-06-29 DIAGNOSIS — Z79899 Other long term (current) drug therapy: Secondary | ICD-10-CM | POA: Insufficient documentation

## 2021-06-29 DIAGNOSIS — Z79890 Hormone replacement therapy (postmenopausal): Secondary | ICD-10-CM | POA: Insufficient documentation

## 2021-06-29 DIAGNOSIS — M75102 Unspecified rotator cuff tear or rupture of left shoulder, not specified as traumatic: Secondary | ICD-10-CM | POA: Diagnosis not present

## 2021-06-29 DIAGNOSIS — E7800 Pure hypercholesterolemia, unspecified: Secondary | ICD-10-CM | POA: Insufficient documentation

## 2021-06-29 DIAGNOSIS — I10 Essential (primary) hypertension: Secondary | ICD-10-CM | POA: Insufficient documentation

## 2021-06-29 DIAGNOSIS — M19012 Primary osteoarthritis, left shoulder: Secondary | ICD-10-CM | POA: Diagnosis not present

## 2021-06-29 DIAGNOSIS — K219 Gastro-esophageal reflux disease without esophagitis: Secondary | ICD-10-CM | POA: Insufficient documentation

## 2021-06-29 HISTORY — DX: Other chronic pain: G89.29

## 2021-06-29 HISTORY — PX: IRRIGATION AND DEBRIDEMENT SHOULDER: SHX5880

## 2021-06-29 HISTORY — DX: Other complications of anesthesia, initial encounter: T88.59XA

## 2021-06-29 HISTORY — DX: Atherosclerotic heart disease of native coronary artery without angina pectoris: I25.10

## 2021-06-29 HISTORY — DX: Gastro-esophageal reflux disease without esophagitis: K21.9

## 2021-06-29 HISTORY — PX: SHOULDER ARTHROSCOPY WITH SUBACROMIAL DECOMPRESSION, ROTATOR CUFF REPAIR AND BICEP TENDON REPAIR: SHX5687

## 2021-06-29 HISTORY — DX: Dyspnea, unspecified: R06.00

## 2021-06-29 HISTORY — PX: RESECTION DISTAL CLAVICAL: SHX5053

## 2021-06-29 HISTORY — DX: Anxiety disorder, unspecified: F41.9

## 2021-06-29 HISTORY — DX: Opioid use, unspecified, uncomplicated: F11.90

## 2021-06-29 SURGERY — SHOULDER ARTHROSCOPY WITH SUBACROMIAL DECOMPRESSION, ROTATOR CUFF REPAIR AND BICEP TENDON REPAIR
Anesthesia: Regional | Site: Shoulder | Laterality: Left

## 2021-06-29 MED ORDER — ROCURONIUM BROMIDE 100 MG/10ML IV SOLN
INTRAVENOUS | Status: DC | PRN
  Administered 2021-06-29: 80 mg via INTRAVENOUS

## 2021-06-29 MED ORDER — BUPIVACAINE HCL (PF) 0.5 % IJ SOLN
INTRAMUSCULAR | Status: DC | PRN
  Administered 2021-06-29: 20 mL via PERINEURAL

## 2021-06-29 MED ORDER — KETOROLAC TROMETHAMINE 30 MG/ML IJ SOLN: 30.0000 mg | Freq: Once | INTRAMUSCULAR | Status: DC | PRN

## 2021-06-29 MED ORDER — FENTANYL CITRATE (PF) 100 MCG/2ML IJ SOLN: 25.0000 ug | INTRAMUSCULAR | Status: DC | PRN

## 2021-06-29 MED ORDER — LIDOCAINE HCL (PF) 2 % IJ SOLN
INTRAMUSCULAR | Status: AC
  Filled 2021-06-29: qty 20

## 2021-06-29 MED ORDER — BUPIVACAINE HCL (PF) 0.25 % IJ SOLN
INTRAMUSCULAR | Status: AC
  Filled 2021-06-29: qty 30

## 2021-06-29 MED ORDER — FENTANYL CITRATE (PF) 100 MCG/2ML IJ SOLN
100.0000 ug | Freq: Once | INTRAMUSCULAR | Status: AC
  Administered 2021-06-29: 100 ug via INTRAVENOUS

## 2021-06-29 MED ORDER — EPHEDRINE 5 MG/ML INJ
INTRAVENOUS | Status: AC
  Filled 2021-06-29: qty 30

## 2021-06-29 MED ORDER — PROPOFOL 500 MG/50ML IV EMUL
INTRAVENOUS | Status: AC
  Filled 2021-06-29: qty 150

## 2021-06-29 MED ORDER — ONDANSETRON HCL 4 MG/2ML IJ SOLN
INTRAMUSCULAR | Status: DC | PRN
  Administered 2021-06-29: 4 mg via INTRAVENOUS

## 2021-06-29 MED ORDER — MIDAZOLAM HCL 2 MG/2ML IJ SOLN
2.0000 mg | Freq: Once | INTRAMUSCULAR | Status: AC
  Administered 2021-06-29: 2 mg via INTRAVENOUS

## 2021-06-29 MED ORDER — SUGAMMADEX SODIUM 500 MG/5ML IV SOLN
INTRAVENOUS | Status: AC
  Filled 2021-06-29: qty 5

## 2021-06-29 MED ORDER — DEXAMETHASONE SODIUM PHOSPHATE 10 MG/ML IJ SOLN
INTRAMUSCULAR | Status: AC
  Filled 2021-06-29: qty 3

## 2021-06-29 MED ORDER — SUGAMMADEX SODIUM 500 MG/5ML IV SOLN
INTRAVENOUS | Status: DC | PRN
  Administered 2021-06-29: 200 mg via INTRAVENOUS

## 2021-06-29 MED ORDER — METHOCARBAMOL 500 MG PO TABS: 500.0000 mg | ORAL_TABLET | Freq: Three times a day (TID) | ORAL | 0 refills | Status: DC | PRN

## 2021-06-29 MED ORDER — ACETAMINOPHEN 500 MG PO TABS
ORAL_TABLET | ORAL | Status: AC
  Filled 2021-06-29: qty 2

## 2021-06-29 MED ORDER — SCOPOLAMINE 1 MG/3DAYS TD PT72
MEDICATED_PATCH | TRANSDERMAL | Status: AC
  Filled 2021-06-29: qty 1

## 2021-06-29 MED ORDER — PHENYLEPHRINE 40 MCG/ML (10ML) SYRINGE FOR IV PUSH (FOR BLOOD PRESSURE SUPPORT)
PREFILLED_SYRINGE | INTRAVENOUS | Status: AC
  Filled 2021-06-29: qty 60

## 2021-06-29 MED ORDER — ACETAMINOPHEN 500 MG PO TABS: 1000.0000 mg | ORAL_TABLET | Freq: Three times a day (TID) | ORAL | 0 refills | Status: AC

## 2021-06-29 MED ORDER — SCOPOLAMINE 1 MG/3DAYS TD PT72
1.0000 | MEDICATED_PATCH | TRANSDERMAL | Status: DC
  Administered 2021-06-29: 1.5 mg via TRANSDERMAL

## 2021-06-29 MED ORDER — FENTANYL CITRATE (PF) 100 MCG/2ML IJ SOLN
INTRAMUSCULAR | Status: DC | PRN
  Administered 2021-06-29: 50 ug via INTRAVENOUS

## 2021-06-29 MED ORDER — OXYCODONE HCL 5 MG PO TABS: 5.0000 mg | ORAL_TABLET | Freq: Once | ORAL | Status: DC | PRN

## 2021-06-29 MED ORDER — CEFAZOLIN SODIUM-DEXTROSE 2-4 GM/100ML-% IV SOLN
INTRAVENOUS | Status: AC
  Filled 2021-06-29: qty 100

## 2021-06-29 MED ORDER — PHENYLEPHRINE HCL (PRESSORS) 10 MG/ML IV SOLN
INTRAVENOUS | Status: DC | PRN
  Administered 2021-06-29 (×3): 80 ug via INTRAVENOUS
  Administered 2021-06-29: 40 ug via INTRAVENOUS

## 2021-06-29 MED ORDER — BUPIVACAINE LIPOSOME 1.3 % IJ SUSP
INTRAMUSCULAR | Status: DC | PRN
  Administered 2021-06-29: 10 mL via PERINEURAL

## 2021-06-29 MED ORDER — ONDANSETRON HCL 4 MG/2ML IJ SOLN
INTRAMUSCULAR | Status: AC
  Filled 2021-06-29: qty 12

## 2021-06-29 MED ORDER — ACETAMINOPHEN 500 MG PO TABS
1000.0000 mg | ORAL_TABLET | Freq: Once | ORAL | Status: AC
  Administered 2021-06-29: 1000 mg via ORAL

## 2021-06-29 MED ORDER — PROPOFOL 10 MG/ML IV BOLUS
INTRAVENOUS | Status: DC | PRN
  Administered 2021-06-29: 200 mg via INTRAVENOUS

## 2021-06-29 MED ORDER — OXYCODONE HCL 5 MG/5ML PO SOLN
5.0000 mg | Freq: Once | ORAL | Status: DC | PRN
Start: 2021-06-29 — End: 2021-06-29

## 2021-06-29 MED ORDER — CEFAZOLIN SODIUM-DEXTROSE 2-4 GM/100ML-% IV SOLN
2.0000 g | INTRAVENOUS | Status: AC
  Administered 2021-06-29: 2 g via INTRAVENOUS

## 2021-06-29 MED ORDER — DEXAMETHASONE SODIUM PHOSPHATE 4 MG/ML IJ SOLN
INTRAMUSCULAR | Status: DC | PRN
  Administered 2021-06-29: 5 mg via INTRAVENOUS

## 2021-06-29 MED ORDER — MEPERIDINE HCL 25 MG/ML IJ SOLN: 6.2500 mg | INTRAMUSCULAR | Status: DC | PRN

## 2021-06-29 MED ORDER — ONDANSETRON HCL 4 MG PO TABS: 4.0000 mg | ORAL_TABLET | Freq: Three times a day (TID) | ORAL | 0 refills | Status: AC | PRN

## 2021-06-29 MED ORDER — FENTANYL CITRATE (PF) 100 MCG/2ML IJ SOLN
INTRAMUSCULAR | Status: AC
  Filled 2021-06-29: qty 2

## 2021-06-29 MED ORDER — CELECOXIB 200 MG PO CAPS: 200.0000 mg | ORAL_CAPSULE | Freq: Two times a day (BID) | ORAL | 0 refills | Status: AC

## 2021-06-29 MED ORDER — EPINEPHRINE PF 1 MG/ML IJ SOLN
INTRAMUSCULAR | Status: AC
  Filled 2021-06-29: qty 3

## 2021-06-29 MED ORDER — LACTATED RINGERS IV SOLN: INTRAVENOUS | Status: DC

## 2021-06-29 MED ORDER — AMISULPRIDE (ANTIEMETIC) 5 MG/2ML IV SOLN: 10.0000 mg | Freq: Once | INTRAVENOUS | Status: DC | PRN

## 2021-06-29 MED ORDER — MIDAZOLAM HCL 2 MG/2ML IJ SOLN
INTRAMUSCULAR | Status: AC
  Filled 2021-06-29: qty 2

## 2021-06-29 MED ORDER — LIDOCAINE HCL (CARDIAC) PF 100 MG/5ML IV SOSY
PREFILLED_SYRINGE | INTRAVENOUS | Status: DC | PRN
  Administered 2021-06-29: 60 mg via INTRAVENOUS

## 2021-06-29 MED ORDER — TRAMADOL HCL 50 MG PO TABS: 50.0000 mg | ORAL_TABLET | Freq: Four times a day (QID) | ORAL | 0 refills | Status: DC | PRN

## 2021-06-29 MED ORDER — SODIUM CHLORIDE 0.9 % IR SOLN
Status: DC | PRN
  Administered 2021-06-29: 13000 mL

## 2021-06-29 MED ORDER — PROMETHAZINE HCL 25 MG/ML IJ SOLN: 6.2500 mg | INTRAMUSCULAR | Status: DC | PRN

## 2021-06-29 SURGICAL SUPPLY — 67 items
AID PSTN UNV HD RSTRNT DISP (MISCELLANEOUS) ×1
ANCH SUT 2 FBRTK KNTLS 1.8 (Anchor) ×2 IMPLANT
ANCH SUT 2.6 FBRSTK 1.7 (Anchor) ×1 IMPLANT
ANCH SUT 2.6 FBRTK 1.7 KNTLS (Anchor) ×1 IMPLANT
ANCH SUT SWLK 19.1X4.75 (Anchor) ×2 IMPLANT
ANCHOR SUT 1.8 FIBERTAK SB KL (Anchor) ×4 IMPLANT
ANCHOR SUT BIO SW 4.75X19.1 (Anchor) ×4 IMPLANT
ANCHOR SUT FBRTK 2.6X1.7X2 (Anchor) ×2 IMPLANT
APL PRP STRL LF DISP 70% ISPRP (MISCELLANEOUS) ×1
BLADE EXCALIBUR 4.0X13 (MISCELLANEOUS) ×2 IMPLANT
BLADE SURG 10 STRL SS (BLADE) IMPLANT
BURR OVAL 8 FLU 4.0X13 (MISCELLANEOUS) IMPLANT
BURR OVAL 8 FLU 5.0X13 (MISCELLANEOUS) ×2 IMPLANT
CANNULA 5.75X71 LONG (CANNULA) IMPLANT
CANNULA PASSPORT 5 (CANNULA) ×2 IMPLANT
CANNULA PASSPORT BUTTON 10-40 (CANNULA) IMPLANT
CANNULA TWIST IN 8.25X7CM (CANNULA) IMPLANT
CHLORAPREP W/TINT 26 (MISCELLANEOUS) ×2 IMPLANT
CLSR STERI-STRIP ANTIMIC 1/2X4 (GAUZE/BANDAGES/DRESSINGS) ×2 IMPLANT
COOLER ICEMAN CLASSIC (MISCELLANEOUS) ×2 IMPLANT
DRAPE IMP U-DRAPE 54X76 (DRAPES) ×2 IMPLANT
DRAPE INCISE IOBAN 66X45 STRL (DRAPES) IMPLANT
DRAPE SHOULDER BEACH CHAIR (DRAPES) ×2 IMPLANT
DRSG PAD ABDOMINAL 8X10 ST (GAUZE/BANDAGES/DRESSINGS) ×2 IMPLANT
DW OUTFLOW CASSETTE/TUBE SET (MISCELLANEOUS) ×2 IMPLANT
GAUZE SPONGE 4X4 12PLY STRL (GAUZE/BANDAGES/DRESSINGS) ×2 IMPLANT
GLOVE SRG 8 PF TXTR STRL LF DI (GLOVE) ×1 IMPLANT
GLOVE SURG ENC MOIS LTX SZ6.5 (GLOVE) ×2 IMPLANT
GLOVE SURG LTX SZ8 (GLOVE) ×2 IMPLANT
GLOVE SURG POLYISO LF SZ7 (GLOVE) ×2 IMPLANT
GLOVE SURG UNDER POLY LF SZ6.5 (GLOVE) ×2 IMPLANT
GLOVE SURG UNDER POLY LF SZ7 (GLOVE) ×4 IMPLANT
GLOVE SURG UNDER POLY LF SZ8 (GLOVE) ×2
GOWN STRL REUS W/ TWL LRG LVL3 (GOWN DISPOSABLE) ×2 IMPLANT
GOWN STRL REUS W/TWL LRG LVL3 (GOWN DISPOSABLE) ×4
GOWN STRL REUS W/TWL XL LVL3 (GOWN DISPOSABLE) ×2 IMPLANT
IMPL FIBERTAK KNTLS 2.6 (Anchor) ×1 IMPLANT
IMPLANT FIBERTAK KNTLS 2.6 (Anchor) ×2 IMPLANT
KIT STABILIZATION SHOULDER (MISCELLANEOUS) ×2 IMPLANT
KIT STR SPEAR 1.8 FBRTK DISP (KITS) ×2 IMPLANT
LASSO CRESCENT QUICKPASS (SUTURE) IMPLANT
MANIFOLD NEPTUNE II (INSTRUMENTS) ×2 IMPLANT
NDL SAFETY ECLIPSE 18X1.5 (NEEDLE) ×1 IMPLANT
NEEDLE HYPO 18GX1.5 SHARP (NEEDLE) ×2
NEEDLE SCORPION MULTI FIRE (NEEDLE) ×2 IMPLANT
PACK ARTHROSCOPY DSU (CUSTOM PROCEDURE TRAY) ×2 IMPLANT
PACK BASIN DAY SURGERY FS (CUSTOM PROCEDURE TRAY) ×2 IMPLANT
PAD COLD SHLDR WRAP-ON (PAD) ×2 IMPLANT
PAD ORTHO SHOULDER 7X19 LRG (SOFTGOODS) ×2 IMPLANT
PORT APPOLLO RF 90DEGREE MULTI (SURGICAL WAND) ×2 IMPLANT
RESTRAINT HEAD UNIVERSAL NS (MISCELLANEOUS) ×2 IMPLANT
SHEET MEDIUM DRAPE 40X70 STRL (DRAPES) ×2 IMPLANT
SLEEVE SCD COMPRESS KNEE MED (STOCKING) ×2 IMPLANT
SLING ARM FOAM STRAP LRG (SOFTGOODS) IMPLANT
SUT FIBERWIRE #2 38 T-5 BLUE (SUTURE)
SUT MNCRL AB 4-0 PS2 18 (SUTURE) ×2 IMPLANT
SUT PDS AB 1 CT  36 (SUTURE) ×2
SUT PDS AB 1 CT 36 (SUTURE) ×1 IMPLANT
SUT TIGER TAPE 7 IN WHITE (SUTURE) IMPLANT
SUTURE FIBERWR #2 38 T-5 BLUE (SUTURE) IMPLANT
SUTURE TAPE TIGERLINK 1.3MM BL (SUTURE) ×1 IMPLANT
SUTURETAPE TIGERLINK 1.3MM BL (SUTURE) ×2
SYR 5ML LL (SYRINGE) ×2 IMPLANT
TAPE FIBER 2MM 7IN #2 BLUE (SUTURE) IMPLANT
TOWEL GREEN STERILE FF (TOWEL DISPOSABLE) ×4 IMPLANT
TUBE CONNECTING 20X1/4 (TUBING) ×2 IMPLANT
TUBING ARTHROSCOPY IRRIG 16FT (MISCELLANEOUS) ×2 IMPLANT

## 2021-06-29 NOTE — Anesthesia Procedure Notes (Signed)
Procedure Name: Intubation Date/Time: 06/29/2021 7:53 AM Performed by: Signe Colt, CRNA Pre-anesthesia Checklist: Patient identified, Emergency Drugs available, Suction available and Patient being monitored Patient Re-evaluated:Patient Re-evaluated prior to induction Oxygen Delivery Method: Circle system utilized Preoxygenation: Pre-oxygenation with 100% oxygen Induction Type: IV induction Ventilation: Mask ventilation without difficulty Laryngoscope Size: Mac and 3 Grade View: Grade II Tube type: Oral Tube size: 7.0 mm Number of attempts: 1 Airway Equipment and Method: Stylet and Oral airway Placement Confirmation: ETT inserted through vocal cords under direct vision, positive ETCO2 and breath sounds checked- equal and bilateral Secured at: 21 cm Tube secured with: Tape Dental Injury: Teeth and Oropharynx as per pre-operative assessment

## 2021-06-29 NOTE — Interval H&P Note (Signed)
History and Physical Interval Note:  06/29/2021 7:19 AM  [name]  has presented today for surgery, with the diagnosis of LEFT SHOULDER [place].  The various methods of treatment have been discussed with the patient and family. After consideration of risks, benefits and other options for treatment, the patient has consented to  Procedure(s): SHOULDER ARTHROSCOPY WITH SUBACROMIAL DECOMPRESSION, ROTATOR CUFF REPAIR AND BICEP TENDON REPAIR (Left) IRRIGATION AND DEBRIDEMENT SHOULDER (Left) RESECTION DISTAL CLAVICAL (Left) as a surgical intervention.  The patient's history has been reviewed, patient examined, no change in status, stable for surgery.  I have reviewed the patient's chart and labs.  Questions were answered to the patient's satisfaction.     Hiram Gash

## 2021-06-29 NOTE — Progress Notes (Signed)
Assisted Dr. Doroteo Glassman with left, ultrasound guided, interscalene  block. Side rails up, monitors on throughout procedure. See vital signs in flow sheet. Tolerated Procedure well.

## 2021-06-29 NOTE — Transfer of Care (Signed)
Immediate Anesthesia Transfer of Care Note  Patient: [name]  Procedure(s) Performed: SHOULDER ARTHROSCOPY WITH SUBACROMIAL DECOMPRESSION, ROTATOR CUFF REPAIR AND BICEP TENDON REPAIR (Left: Shoulder) IRRIGATION AND DEBRIDEMENT SHOULDER (Left: Shoulder) RESECTION DISTAL CLAVICLE (Left: Shoulder)  Patient Location: PACU  Anesthesia Type:GA combined with regional for post-op pain  Level of Consciousness: awake, alert , oriented and patient cooperative  Airway & Oxygen Therapy: Patient Spontanous Breathing and Patient connected to face mask oxygen  Post-op Assessment: Report given to RN and Post -op Vital signs reviewed and stable  Post vital signs: Reviewed and stable  Last Vitals:  Vitals Value Taken Time  BP    Temp    Pulse 94 06/29/21 0923  Resp 20 06/29/21 0923  SpO2 96 % 06/29/21 0923  Vitals shown include unvalidated device data.  Last Pain:  Vitals:   06/29/21 0646  TempSrc: Oral  PainSc: 5       Patients Stated Pain Goal: 3 (09/07/56 4734)  Complications: No notable events documented.

## 2021-06-29 NOTE — Op Note (Signed)
Orthopaedic Surgery Operative Note (CSN: [id number])  [name]  1966/10/27 Date of Surgery: 06/29/2021   Diagnoses:  Left shoulder impingement, rotator cuff tear, biceps tendinitis, AC arthritis  Procedure: Arthroscopic extensive debridement Arthroscopic subacromial decompression Arthroscopic rotator cuff repair Arthroscopic biceps tenodesis Arthroscopic distal clavicle excision   Operative Finding Exam under anesthesia: Full motion no limitation no instability Articular space: No loose bodies, capsule intact, l anterior and superior labral fraying Chondral surfaces:Intact, no sign of chondral degeneration on the glenoid or humeral head Biceps: Significant redness throughout the biceps and early subluxation and irritation of the subscapularis upper border Subscapularis: Mild 5 to 10% upper border fraying, this seem to be related to the biceps tendinitis and irritation of the area.  Biceps tenodesis was performed and we debrided the upper border the subscapularis. Superior Cuff: Full-thickness tear of the leading 50% of the supraspinatus. Bursal side: As above, tissue quality was reasonable.  Successful completion of the planned procedure.  Patient can start early therapy as we worry about stiffness.  She has a history of chronic pain and we worried that this in conjunction with her extensive surgery may lead to her becoming arthrofibrotic.   Post-operative plan: The patient will be non-weightbearing in a sling for 6 weeks.  The patient will be discharged home.  DVT prophylaxis not indicated in ambulatory upper extremity patient without known risk factors.   Pain control with PRN pain medication preferring oral medicines.  Follow up plan will be scheduled in approximately 7 days for incision check and XR.  Post-Op Diagnosis: Same Surgeons:Primary: Hiram Gash, MD Assistants:Caroline McBane PA-C Location: [facility] OR ROOM 5 Anesthesia: General with Exparel interscalene  block Antibiotics: Ancef 2 g Tourniquet time: None Estimated Blood Loss: Minimal Complications: None Specimens: None Implants: Implant Name Type Inv. Item Serial No. Manufacturer Lot No. LRB No. Used Action  ANCHOR SUT FBRTK 2.6X1.7X2 - [id number] Anchor ANCHOR SUT FBRTK 2.6X1.7X2  [employer] [id number] Left 1 Implanted  ANCHOR SUT BIO SW 4.75X19.1 - [id number] Anchor ANCHOR SUT BIO SW 4.75X19.1  [employer] [id number] Left 1 Implanted  ANCHOR SUT BIO SW 4.75X19.1 - [id number] Anchor ANCHOR SUT BIO SW 4.75X19.1  [employer] [id number] Left 1 Implanted  Knotless 2.6 fiberTak RC (White/Black)    [employer] [id number] Left 1 Implanted  1.8 Knotless FiberTak Soft Anchor    [employer] [id number] Left 1 Implanted  1.8 Knotless FiberTak Soft Anchor    [place] [id number] Left 1 Implanted    Indications for Surgery:   [name] is a 55 y.o. female with continued shoulder pain refractory to nonoperative measures for extended period of time.    The risks and benefits were explained at length including but not limited to continued pain, cuff failure, biceps tenodesis failure, stiffness, need for further surgery and infection.   Procedure:   Patient was correctly identified in the preoperative holding area and operative site marked.  Patient brought to OR and positioned beachchair on an [place] table ensuring that all bony prominences were padded and the head was in an appropriate location.  Anesthesia was induced and the operative shoulder was prepped and draped in the usual sterile fashion.  Timeout was called preincision.  A standard posterior viewing portal was made after localizing the portal with a spinal needle.  An anterior accessory portal was also made.  After clearing the articular space the camera was positioned in the subacromial space.  Findings above.    Extensive debridement was performed of the anterior interval  tissue, labral fraying and the bursa.  Subacromial decompression: We made a  lateral portal with spinal needle guidance. We then proceeded to debride bursal tissue extensively with a shaver and arthrocare device. At that point we continued to identify the borders of the acromion and identify the spur. We then carefully preserved the deltoid fascia and used a burr to convert the acromion to a Type 1 flat acromion without issue.  Biceps tenodesis: We marked the tendon and then performed a tenotomy and debridement of the stump in the articular space. We then identified the biceps tendon in its groove suprapec with the arthroscope in the lateral portal taking care to move from lateral to medial to avoid injury to the subscapularis. At that point we unroofed the tendon itself and mobilized it. An accessory anterior portal was made in line with the tendon and we grasped it from the anterior superior portal and worked from the accessory anterior portal. Two Fibertak 1.91mm knotless anchors were placed in the groove and the tendon was secured in a luggage loop style fashion with a pass of the limb of suture through the tendon using a scorpion device to avoid pull-through.  Repair was completed with good tension on the tendon.  Residual stump of the tendon was removed after being resected with a RF ablator.  Distal Clavicle resection:  The scope was placed in the subacromial space from the posterior portal.  A hemostat was placed through the anterior portal and we spread at the [facility] joint.  A burr was then inserted and 10 mm of distal clavicle was resected taking care to avoid damage to the capsule around the joint and avoiding overhanging bone posteriorly.    Arthroscopic rotator cuff repair: Once the above is complete we used a shaver as well as a bur to prepare the tuberosity and clear soft tissue so that there was bony bleeding and appropriate bloody flecks for healing.  We then placed 2 Medial Row 2.6 mm fiber tack anchors with knotless mechanisms and tape.  We used a scorpion as well as a  link suture to pass all of the sutures from each anchor through the cuff.  We then were able to use the knotless mechanism sutures to perform a double medial row tiedown compressing the medial row without overtensioning.  Once this was complete we cut the switch sutures and performed a speed bridge type repair pulling the tapes over to 2 lateral row 4.75 mm bio composite swivel locks.  This provided compression of the cuff to the tuberosity.  The incisions were closed with absorbable monocryl and steri strips.  A sterile dressing was placed along with a sling. The patient was awoken from general anesthesia and taken to the PACU in stable condition without complication.   Noemi Chapel, PA-C, present and scrubbed throughout the case, critical for completion in a timely fashion, and for retraction, instrumentation, closure.

## 2021-06-29 NOTE — Anesthesia Postprocedure Evaluation (Signed)
Anesthesia Post Note  Patient: Zakhia Burtnett  Procedure(s) Performed: SHOULDER ARTHROSCOPY WITH SUBACROMIAL DECOMPRESSION, ROTATOR CUFF REPAIR AND BICEP TENDON REPAIR (Left: Shoulder) IRRIGATION AND DEBRIDEMENT SHOULDER (Left: Shoulder) RESECTION DISTAL CLAVICLE (Left: Shoulder)     Patient location during evaluation: PACU Anesthesia Type: Regional and General Level of consciousness: awake and alert, oriented and patient cooperative Pain management: pain level controlled Vital Signs Assessment: post-procedure vital signs reviewed and stable Respiratory status: spontaneous breathing, nonlabored ventilation and respiratory function stable Cardiovascular status: blood pressure returned to baseline and stable Postop Assessment: no apparent nausea or vomiting Anesthetic complications: no   No notable events documented.  Last Vitals:  Vitals:   06/29/21 0945 06/29/21 1012  BP: 125/86 134/90  Pulse: 98 96  Resp: 20 18  Temp:  36.7 C  SpO2: 92% 94%    Last Pain:  Vitals:   06/29/21 1012  TempSrc:   PainSc: 0-No pain                 Pervis Hocking

## 2021-06-29 NOTE — Anesthesia Procedure Notes (Signed)
Anesthesia Regional Block: Interscalene brachial plexus block   Pre-Anesthetic Checklist: , timeout performed,  Correct Patient, Correct Site, Correct Laterality,  Correct Procedure, Correct Position, site marked,  Risks and benefits discussed,  Surgical consent,  Pre-op evaluation,  At surgeon's request and post-op pain management  Laterality: Left  Prep: Maximum Sterile Barrier Precautions used, chloraprep       Needles:  Injection technique: Single-shot  Needle Type: Echogenic Stimulator Needle     Needle Length: 9cm  Needle Gauge: 22     Additional Needles:   Procedures:,,,, ultrasound used (permanent image in chart),,    Narrative:  Start time: 06/29/2021 7:00 AM End time: 06/29/2021 7:10 AM Injection made incrementally with aspirations every 5 mL.  Performed by: Personally  Anesthesiologist: Pervis Hocking, DO  Additional Notes: Monitors applied. No increased pain on injection. No increased resistance to injection. Injection made in 5cc increments. Good needle visualization. Patient tolerated procedure well.

## 2021-06-29 NOTE — Discharge Instructions (Addendum)
Ophelia Charter MD, MPH Noemi Chapel, PA-C [place] [address] [phone] (tel)   [phone] (fax)   POST-OPERATIVE INSTRUCTIONS - SHOULDER ARTHROSCOPY  WOUND CARE You may remove the Operative Dressing on Post-Op Day #3 (72hrs after surgery).   Alternatively if you would like you can leave dressing on until follow-up if within 7-8 days but keep it dry. Leave steri-strips in place until they fall off on their own, usually 2 weeks postop. There may be a small amount of fluid/bleeding leaking at the surgical site.  This is normal; the shoulder is filled with fluid during the procedure and can leak for 24-48hrs after surgery.  You may change/reinforce the bandage as needed.  Use the Cryocuff or Ice as often as possible for the first 7 days, then as needed for pain relief. Always keep a towel, ACE wrap or other barrier between the cooling unit and your skin.  You may shower on Post-Op Day #3. Gently pat the area dry. Do not soak the shoulder in water or submerge it. Keep dry incisions as dry as possible. Do not go swimming in the pool or ocean until 4 weeks after surgery or when otherwise instructed.    EXERCISES/BRACING Sling should be used at all times until follow-up.  You can remove sling for hygiene.    Please continue to ambulate and do not stay sitting or lying for too long. Perform foot and wrist pumps to assist in circulation.  POST-OP MEDICATIONS- Multimodal approach to pain control In general your pain will be controlled with a combination of substances.  Prescriptions unless otherwise discussed are electronically sent to your pharmacy.  This is a carefully made plan we use to minimize narcotic use.     Celebrex - Anti-inflammatory medication taken on a scheduled basis Acetaminophen - Non-narcotic pain medicine taken on a scheduled basis  Tramadol - This is a strong narcotic, to be used only on an "as needed" basis for SEVERE pain. Use  this medication ONLY if your pain is NOT controlled by your daily pain prescription Robaxin - this is a muscle relaxer, take as needed for muscle spasms Zofran - take as needed for nausea   FOLLOW-UP If you develop a Fever (?101.5), Redness or Drainage from the surgical incision site, please call our office to arrange for an evaluation. Please call the office to schedule a follow-up appointment for your suture removal, 10-14 days post-operatively.    HELPFUL INFORMATION  If you had a block, it will wear off between 8-24 hrs postop typically.  This is period when your pain may go from nearly zero to the pain you would have had postop without the block.  This is an abrupt transition but nothing dangerous is happening.  You may take an extra dose of narcotic when this happens.  You may be more comfortable sleeping in a semi-seated position the first few nights following surgery.  Keep a pillow propped under the elbow and forearm for comfort.  If you have a recliner type of chair it might be beneficial.  If not that is fine too, but it would be helpful to sleep propped up with pillows behind your operated shoulder as well under your elbow and forearm.  This will reduce pulling on the suture lines.  When dressing, put your operative arm in the sleeve first.  When getting undressed, take your operative arm out last.  Loose fitting, button-down shirts are recommended.  Often in the first days after  surgery you may be more comfortable keeping your operative arm under your shirt and not through the sleeve.  You may return to work/school in the next couple of days when you feel up to it.  Desk work and typing in the sling is fine.  We suggest you use the pain medication the first night prior to going to bed, in order to ease any pain when the anesthesia wears off. You should avoid taking pain medications on an empty stomach as it will make you nauseous.  You should wean off your narcotic medicines as  soon as you are able.  Most patients will be off or using minimal narcotics before their first postop appointment.   Do not drink alcoholic beverages or take illicit drugs when taking pain medications.  It is against the law to drive while taking narcotics.  In some states it is against the law to drive while your arm is in a sling.   Pain medication may make you constipated.  Below are a few solutions to try in this order: Decrease the amount of pain medication if you aren't having pain. Drink lots of decaffeinated fluids. Drink prune juice and/or eat dried prunes  If the first 3 don't work start with additional solutions Take Colace - an over-the-counter stool softener Take Senokot - an over-the-counter laxative Take Miralax - a stronger over-the-counter laxative  For more information including helpful videos and documents visit our website:   https://[email or web address].com/patient-information.[email or web address]   May take Tylenol 1pm, if needed.    Post Anesthesia Home Care Instructions  Activity: Get plenty of rest for the remainder of the day. A responsible individual must stay with you for 24 hours following the procedure.  For the next 24 hours, DO NOT: -Drive a car -Paediatric nurse -Drink alcoholic beverages -Take any medication unless instructed by your physician -Make any legal decisions or sign important papers.  Meals: Start with liquid foods such as gelatin or soup. Progress to regular foods as tolerated. Avoid greasy, spicy, heavy foods. If nausea and/or vomiting occur, drink only clear liquids until the nausea and/or vomiting subsides. Call your physician if vomiting continues.  Special Instructions/Symptoms: Your throat may feel dry or sore from the anesthesia or the breathing tube placed in your throat during surgery. If this causes discomfort, gargle with warm salt water. The discomfort should disappear within 24 hours.  If you had a scopolamine patch placed behind your  ear for the management of post- operative nausea and/or vomiting:  1. The medication in the patch is effective for 72 hours, after which it should be removed.  Wrap patch in a tissue and discard in the trash. Wash hands thoroughly with soap and water. 2. You may remove the patch earlier than 72 hours if you experience unpleasant side effects which may include dry mouth, dizziness or visual disturbances. 3. Avoid touching the patch. Wash your hands with soap and water after contact with the patch.    Regional Anesthesia Blocks  1. Numbness or the inability to move the "blocked" extremity may last from 3-48 hours after placement. The length of time depends on the medication injected and your individual response to the medication. If the numbness is not going away after 48 hours, call your surgeon.  2. The extremity that is blocked will need to be protected until the numbness is gone and the  Strength has returned. Because you cannot feel it, you will need to take extra care to avoid injury. Because it may be  weak, you may have difficulty moving it or using it. You may not know what position it is in without looking at it while the block is in effect.  3. For blocks in the legs and feet, returning to weight bearing and walking needs to be done carefully. You will need to wait until the numbness is entirely gone and the strength has returned. You should be able to move your leg and foot normally before you try and bear weight or walk. You will need someone to be with you when you first try to ensure you do not fall and possibly risk injury.  4. Bruising and tenderness at the needle site are common side effects and will resolve in a few days.  5. Persistent numbness or new problems with movement should be communicated to the surgeon or the [facility] [phone] [place] [phone]). Information for Discharge Teaching: EXPAREL (bupivacaine liposome injectable  suspension)   Your surgeon or anesthesiologist gave you EXPAREL(bupivacaine) to help control your pain after surgery.  EXPAREL is a local anesthetic that provides pain relief by numbing the tissue around the surgical site. EXPAREL is designed to release pain medication over time and can control pain for up to 72 hours. Depending on how you respond to EXPAREL, you may require less pain medication during your recovery.  Possible side effects: Temporary loss of sensation or ability to move in the area where bupivacaine was injected. Nausea, vomiting, constipation Rarely, numbness and tingling in your mouth or lips, lightheadedness, or anxiety may occur. Call your doctor right away if you think you may be experiencing any of these sensations, or if you have other questions regarding possible side effects.  Follow all other discharge instructions given to you by your surgeon or nurse. Eat a healthy diet and drink plenty of water or other fluids.  If you return to the hospital for any reason within 96 hours following the administration of EXPAREL, it is important for health care providers to know that you have received this anesthetic. A teal colored band has been placed on your arm with the date, time and amount of EXPAREL you have received in order to alert and inform your health care providers. Please leave this armband in place for the full 96 hours following administration, and then you may remove the band.

## 2021-06-30 NOTE — Addendum Note (Signed)
Addendum  created 06/30/21 1212 by Keyri Salberg, Ernesta Amble, CRNA   Charge Capture section accepted

## 2021-07-04 ENCOUNTER — Encounter (HOSPITAL_BASED_OUTPATIENT_CLINIC_OR_DEPARTMENT_OTHER): Payer: Self-pay | Admitting: Orthopaedic Surgery

## 2021-07-05 ENCOUNTER — Other Ambulatory Visit: Payer: Self-pay

## 2021-07-05 ENCOUNTER — Encounter: Payer: Self-pay | Admitting: Physical Therapy

## 2021-07-05 ENCOUNTER — Ambulatory Visit: Payer: Medicaid Other | Attending: Orthopaedic Surgery | Admitting: Physical Therapy

## 2021-07-05 DIAGNOSIS — R600 Localized edema: Secondary | ICD-10-CM | POA: Diagnosis present

## 2021-07-05 DIAGNOSIS — G8929 Other chronic pain: Secondary | ICD-10-CM | POA: Insufficient documentation

## 2021-07-05 DIAGNOSIS — M25511 Pain in right shoulder: Secondary | ICD-10-CM | POA: Diagnosis present

## 2021-07-05 DIAGNOSIS — M25611 Stiffness of right shoulder, not elsewhere classified: Secondary | ICD-10-CM | POA: Diagnosis present

## 2021-07-05 NOTE — Therapy (Signed)
[facility] [place], [place], [address] Phone: [phone]   Fax:  [phone]  Physical Therapy Evaluation  Patient Details  Name: [name] MRN: [id number] Date of Birth: 10-20-1966 Referring Provider (PT): Ophelia Charter MD   Encounter Date: 07/05/2021   PT End of Session - 07/05/21 1333     Visit Number 1    Number of Visits 12    Date for PT Re-Evaluation 08/16/21    PT Start Time 0103    PT Stop Time 0127    PT Time Calculation (min) 24 min    Activity Tolerance Patient tolerated treatment well    Behavior During Therapy  Hospital for tasks assessed/performed             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[place])    Chronic back pain    Chronic, continuous use of opioids    Complication of anesthesia    wakes up during surgery   Coronary artery disease    non-obstr CAD-treat medically   Depression    Diverticulitis    Dyspnea    with exertion   GERD (gastroesophageal reflux disease)    High cholesterol    Hypertension     Past Surgical History:  Procedure Laterality Date   ABDOMINAL HYSTERECTOMY     BRAIN TUMOR EXCISION     CARPAL TUNNEL RELEASE     CHOLECYSTECTOMY     ENDOMETRIAL ABLATION     IRRIGATION AND DEBRIDEMENT SHOULDER Left 06/29/2021   Procedure: IRRIGATION AND DEBRIDEMENT SHOULDER;  Surgeon: Hiram Gash, MD;  Location: [facility] [facility];  Service: Orthopedics;  Laterality: Left;  exparel block   RESECTION DISTAL CLAVICAL Left 06/29/2021   Procedure: RESECTION DISTAL CLAVICLE;  Surgeon: Hiram Gash, MD;  Location: [facility];  Service: Orthopedics;  Laterality: Left;  exparel block   SHOULDER ARTHROSCOPY WITH SUBACROMIAL DECOMPRESSION, ROTATOR CUFF REPAIR AND BICEP TENDON REPAIR Left 06/29/2021   Procedure: SHOULDER ARTHROSCOPY WITH SUBACROMIAL DECOMPRESSION, ROTATOR CUFF REPAIR AND BICEP TENDON REPAIR;  Surgeon: Hiram Gash,  MD;  Location: [facility];  Service: Orthopedics;  Laterality: Left;  exparel block   TUBAL LIGATION      There were no vitals filed for this visit.    Subjective Assessment - 07/05/21 1337     Subjective COVID-19 screen performed prior to patient entering clinic.  The patient presents to the clinic today s/p left shoulder SAD, DCR, Biceps tenodesis and RCR performed on 06/29/21.  the patient reports that since her surgery she fell on her right shoulder with her sling donned and she felt a "pop."  She sees her surgeon on 07/07/21 and will discuss this with him.  her pain is rated at an 8/10 today and higher when out of sling.  She has found nothing reallt reduces her pain at this time.    Pertinent History Chronic back pain, HTN, GERD, CTR.    Patient Stated Goals She left UE without pain.    Currently in Pain? Yes    Pain Score 8     Pain Location Shoulder    Pain Orientation Left    Pain Descriptors / Indicators Sharp;Dull    Pain Type Surgical pain    Pain Onset More than a month ago    Pain Frequency Constant    Aggravating Factors  See above.    Pain Relieving Factors See above.  [facility] PT Assessment - 07/05/21 0001       Assessment   Medical Diagnosis Left shoulder SAD, DCR, biceps tenodesis and RCR.    Referring Provider (PT) Ophelia Charter MD    Onset Date/Surgical Date --   06/29/21 (surgery date).     Precautions   Precaution Comments Begin with gentle left shoulder PROM.    Required Braces or Orthoses --   Left shoulder sling with abduction pillow.     Restrictions   Other Position/Activity Restrictions No left UE weight bearing.      Balance Screen   Has the patient fallen in the past 6 months Yes    How many times? 1.    Has the patient had a decrease in activity level because of a fear of falling?  Yes    Is the patient reluctant to leave their home because of a fear of falling?  No      Home Environment   Living Environment  Private residence      Observation/Other Assessments   Observations Steri-strips intact over left shoulder.  Swelling in left anterior triangle and diffusely over left shoulder.      ROM / Strength   AROM / PROM / Strength PROM      PROM   Overall PROM Comments In supine: Patient's left shoulder flexion is limited to 35 degrees and ER to 0 degrees.      Palpation   Palpation comment C/o diffuse left shoulder pain currently.                        Objective measurements completed on examination: See above findings.                    PT Long Term Goals - 07/05/21 1443       PT LONG TERM GOAL #1   Title Independent with a HEP.    Baseline No knowledge of appropriate ther ex.    Time 6    Period Weeks    Status New      PT LONG TERM GOAL #2   Title Active left shoulder flexion to 145 degrees so the patient can easily reach overhead.    Baseline Passive left shoulder flexion is 35 degrees.    Time 6    Period Weeks    Status New      PT LONG TERM GOAL #3   Title Active ER to 70 degrees+ to allow for easily donning/doffing of apparel.    Baseline Passive left shoulder ER is 0 degrees.    Time 6    Period Weeks    Status New      PT LONG TERM GOAL #4   Title Increase left shoulder strength to a solid 4+/5 to increase stability for performance of functional activities.    Baseline NT    Time 6    Period Weeks    Status New      PT LONG TERM GOAL #5   Title Perform ADL's with pain not > 3/10.    Time 6    Period Weeks    Status New                    Plan - 07/05/21 1433     Clinical Impression Statement The patient presents to OPPT s/p left shoulder SAD, DCR, biceps tendodesis and RCR performed on 06/29/21.  She is currently in a great deal of  pain.  She reports falling on her left shoulder with her sling donned following surgery.  Her left shoulder was assessed with gentle passive of motion which is very limited currently.   She has visible swelling diffusely around her shoulder an in the region of the left anterior triangle of her neck.  Her steri-strips are intact.  She states she has been compliant to her sling usage with an abduction pillow.  Patient will benefit from skilled physical therapy intervention to address pain and deficits.    Personal Factors and Comorbidities Comorbidity 1;Comorbidity 2;Other    Comorbidities Chronic back pain, HTN, GERD, CTR.    Examination-Activity Limitations Other;Reach Overhead    Examination-Participation Restrictions Other    Stability/Clinical Decision Making Stable/Uncomplicated    Clinical Decision Making Low    Rehab Potential Good    PT Frequency 2x / week    PT Duration 6 weeks    PT Treatment/Interventions ADLs/Self Care Home Management;Therapeutic activities;Therapeutic exercise;Manual techniques;Patient/family education;Passive range of motion    PT Next Visit Plan Begin with gentle passive range of motion to patient's left shoulder.    Consulted and Agree with Plan of Care Patient             Patient will benefit from skilled therapeutic intervention in order to improve the following deficits and impairments:  Decreased range of motion, Increased edema, Pain, Decreased activity tolerance  Visit Diagnosis: Chronic right shoulder pain - Plan: PT plan of care cert/re-cert  Stiffness of right shoulder, not elsewhere classified - Plan: PT plan of care cert/re-cert  Localized edema - Plan: PT plan of care cert/re-cert     Problem List Patient Active Problem List   Diagnosis Date Noted   Diverticulitis 03/10/2012   Nausea and vomiting 03/10/2012   Hypertension     Cash Duce, [place] MPT 07/05/2021, 3:06 PM  [facility] [address] [place], [place], [address] Phone: [phone]   Fax:  [phone]  Name: [name] MRN: [id number] Date of Birth: [birth year]

## 2021-07-14 ENCOUNTER — Ambulatory Visit: Payer: Medicaid Other | Attending: Orthopaedic Surgery | Admitting: *Deleted

## 2021-07-14 ENCOUNTER — Other Ambulatory Visit: Payer: Self-pay

## 2021-07-14 DIAGNOSIS — M25611 Stiffness of right shoulder, not elsewhere classified: Secondary | ICD-10-CM | POA: Diagnosis present

## 2021-07-14 DIAGNOSIS — R600 Localized edema: Secondary | ICD-10-CM | POA: Insufficient documentation

## 2021-07-14 DIAGNOSIS — G8929 Other chronic pain: Secondary | ICD-10-CM

## 2021-07-14 DIAGNOSIS — M25511 Pain in right shoulder: Secondary | ICD-10-CM | POA: Insufficient documentation

## 2021-07-14 NOTE — Therapy (Signed)
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[place])    Chronic back pain    Chronic, continuous use of opioids    Complication of anesthesia    wakes up during surgery   Coronary artery disease    non-obstr CAD-treat medically   Depression    Diverticulitis    Dyspnea    with exertion   GERD (gastroesophageal reflux disease)    High cholesterol    Hypertension     Past Surgical History:  Procedure Laterality Date   ABDOMINAL HYSTERECTOMY     BRAIN TUMOR EXCISION     CARPAL TUNNEL RELEASE     CHOLECYSTECTOMY     ENDOMETRIAL ABLATION     IRRIGATION AND DEBRIDEMENT SHOULDER Left 06/29/2021   Procedure: IRRIGATION AND DEBRIDEMENT SHOULDER;  Surgeon: Hiram Gash, MD;  Location: [facility];  Service: Orthopedics;  Laterality: Left;  exparel block   RESECTION DISTAL CLAVICAL Left 06/29/2021   Procedure: RESECTION DISTAL CLAVICLE;  Surgeon: Hiram Gash, MD;  Location: [facility];  Service: Orthopedics;  Laterality: Left;  exparel block   SHOULDER ARTHROSCOPY WITH SUBACROMIAL DECOMPRESSION, ROTATOR CUFF REPAIR AND BICEP TENDON REPAIR Left 06/29/2021   Procedure: SHOULDER ARTHROSCOPY WITH SUBACROMIAL DECOMPRESSION, ROTATOR CUFF REPAIR AND BICEP TENDON REPAIR;  Surgeon: Hiram Gash, MD;  Location: [facility];  Service: Orthopedics;  Laterality: Left;  exparel block   TUBAL LIGATION       There were no vitals filed for this visit.   Subjective Assessment - 07/14/21 1120     Subjective COVID-19 screen performed prior to patient entering clinic.  The patient presents to the clinic today s/p left shoulder SAD, DCR, Biceps tenodesis and RCR performed on 06/29/21.      Pt reports f/u with MD after fall with x-ray, but unable to check for any ST damage.    Patient is accompained by: Interpreter    Pertinent History Chronic back pain, HTN, GERD, CTR.    Patient Stated Goals She left UE without pain.    Currently in Pain? Yes    Pain Score 7     Pain Location Shoulder    Pain Orientation Left    Pain Descriptors / Indicators Sharp;Dull    Pain Type Surgical pain    Pain Onset More than a month ago                               [facility] Adult PT Treatment/Exercise - 07/14/21 0001       Manual Therapy   Manual Therapy Passive ROM    Passive ROM Manual PROM LT shldr supine position.flexion to 90 degrees,ER 20 degrees, IR to abdomen, ABD to 60 degrees  PT Long Term Goals - 07/05/21 1443       PT LONG TERM GOAL #1   Title Independent with a HEP.    Baseline No knowledge of appropriate ther ex.    Time 6    Period Weeks    Status New      PT LONG TERM GOAL #2   Title Active left shoulder flexion to 145 degrees so the patient can easily reach overhead.    Baseline Passive left shoulder flexion is 35 degrees.    Time 6    Period Weeks    Status New      PT LONG TERM GOAL #3   Title Active ER to 70 degrees+ to allow for easily donning/doffing of apparel.    Baseline Passive left shoulder ER is 0 degrees.    Time 6    Period Weeks    Status New      PT LONG TERM GOAL #4   Title Increase left shoulder strength to a solid 4+/5 to increase stability for performance of functional activities.    Baseline NT    Time 6    Period Weeks    Status New      PT LONG TERM GOAL #5   Title Perform ADL's with pain  not > 3/10.    Time 6    Period Weeks    Status New                   Plan - 07/14/21 1155     Clinical Impression Statement Pt arrived today doing fair with LT shldr. She reports f/u with MD after fall with   a good x-ray report , but unable to know of any ST damage. She did have notable skin bruising/ discoloration along bicep area. PROM performed flxion to 90 degrees, ER to 20, IR to abdomen and ABD to 60 degrees. Pt requested to ice at home. She was also advised to wear sling to protect surgical sites.    Personal Factors and Comorbidities Comorbidity 1;Comorbidity 2;Other    Comorbidities Chronic back pain, HTN, GERD, CTR.    Rehab Potential Good    PT Frequency 2x / week    PT Duration 6 weeks    PT Treatment/Interventions ADLs/Self Care Home Management;Therapeutic activities;Therapeutic exercise;Manual techniques;Patient/family education;Passive range of motion    PT Next Visit Plan Begin with gentle passive range of motion to patient's left shoulder.    Consulted and Agree with Plan of Care Patient             Patient will benefit from skilled therapeutic intervention in order to improve the following deficits and impairments:  Decreased range of motion, Increased edema, Pain, Decreased activity tolerance  Visit Diagnosis: Chronic right shoulder pain  Stiffness of right shoulder, not elsewhere classified     Problem List Patient Active Problem List   Diagnosis Date Noted   Diverticulitis 03/10/2012   Nausea and vomiting 03/10/2012   Hypertension     Treena Cosman,CHRIS, PTA 07/14/2021, 12:14 PM  [facility] [address] [place], [place], [address] Phone: [phone]   Fax:  [phone]  Name: [name] MRN: [id number] Date of Birth: 10-13-1966

## 2021-07-21 ENCOUNTER — Ambulatory Visit: Payer: Medicaid Other | Admitting: *Deleted

## 2021-07-21 ENCOUNTER — Other Ambulatory Visit: Payer: Self-pay

## 2021-07-28 ENCOUNTER — Other Ambulatory Visit: Payer: Self-pay

## 2021-07-28 ENCOUNTER — Ambulatory Visit: Payer: Medicaid Other | Admitting: *Deleted

## 2021-07-28 DIAGNOSIS — R600 Localized edema: Secondary | ICD-10-CM

## 2021-07-28 DIAGNOSIS — M25511 Pain in right shoulder: Secondary | ICD-10-CM

## 2021-07-28 DIAGNOSIS — G8929 Other chronic pain: Secondary | ICD-10-CM

## 2021-07-28 DIAGNOSIS — M25611 Stiffness of right shoulder, not elsewhere classified: Secondary | ICD-10-CM

## 2021-07-28 NOTE — Therapy (Signed)
[facility] [place], [place], [address] Phone: [phone]   Fax:  [phone]  Physical Therapy Treatment  Patient Details  Name: Shelonda Bollenbacher MRN: [id number] Date of Birth: 03-23-1966 Referring Provider (PT): Ophelia Charter MD   Encounter Date: 07/28/2021   PT End of Session - 07/28/21 1212     Visit Number 3    Number of Visits 12    Date for PT Re-Evaluation 08/16/21    PT Start Time 1115    PT Stop Time 84    PT Time Calculation (min) 32 min             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[place])    Chronic back pain    Chronic, continuous use of opioids    Complication of anesthesia    wakes up during surgery   Coronary artery disease    non-obstr CAD-treat medically   Depression    Diverticulitis    Dyspnea    with exertion   GERD (gastroesophageal reflux disease)    High cholesterol    Hypertension     Past Surgical History:  Procedure Laterality Date   ABDOMINAL HYSTERECTOMY     BRAIN TUMOR EXCISION     CARPAL TUNNEL RELEASE     CHOLECYSTECTOMY     ENDOMETRIAL ABLATION     IRRIGATION AND DEBRIDEMENT SHOULDER Left 06/29/2021   Procedure: IRRIGATION AND DEBRIDEMENT SHOULDER;  Surgeon: Hiram Gash, MD;  Location: [facility];  Service: Orthopedics;  Laterality: Left;  exparel block   RESECTION DISTAL CLAVICAL Left 06/29/2021   Procedure: RESECTION DISTAL CLAVICLE;  Surgeon: Hiram Gash, MD;  Location: [facility];  Service: Orthopedics;  Laterality: Left;  exparel block   SHOULDER ARTHROSCOPY WITH SUBACROMIAL DECOMPRESSION, ROTATOR CUFF REPAIR AND BICEP TENDON REPAIR Left 06/29/2021   Procedure: SHOULDER ARTHROSCOPY WITH SUBACROMIAL DECOMPRESSION, ROTATOR CUFF REPAIR AND BICEP TENDON REPAIR;  Surgeon: Hiram Gash, MD;  Location: [facility];  Service: Orthopedics;  Laterality: Left;  exparel block   TUBAL LIGATION       There were no vitals filed for this visit.                      [facility] Adult PT Treatment/Exercise - 07/28/21 0001       Manual Therapy   Manual Therapy Passive ROM    Passive ROM Manual PROM LT shldr supine position.flexion to 90 degrees,ER 40 degrees, IR to abdomen, ABD to 60 degrees                         PT Long Term Goals - 07/05/21 1443       PT LONG TERM GOAL #1   Title Independent with a HEP.    Baseline No knowledge of appropriate ther ex.    Time 6    Period Weeks    Status New      PT LONG TERM GOAL #2   Title Active left shoulder flexion to 145 degrees so the patient can easily reach overhead.    Baseline Passive left shoulder flexion is 35 degrees.    Time 6    Period Weeks    Status New      PT LONG TERM GOAL #3   Title Active ER to 70 degrees+ to allow for easily donning/doffing of apparel.  Baseline Passive left shoulder ER is 0 degrees.    Time 6    Period Weeks    Status New      PT LONG TERM GOAL #4   Title Increase left shoulder strength to a solid 4+/5 to increase stability for performance of functional activities.    Baseline NT    Time 6    Period Weeks    Status New      PT LONG TERM GOAL #5   Title Perform ADL's with pain not > 3/10.    Time 6    Period Weeks    Status New                   Plan - 07/28/21 1116     Clinical Impression Statement Pt arrived today doing fair, but reports LT shldr pain when trying to use LT shldr and unable to sleep well. Discussion about trying not to use LT UE and if she needs to make sure to keep her arm close to her body. Rx focused on PROM LT shldr flexion to 90 degrees, ER to 40 degrees and ABD to 60 degrees. Pt to ice at home as per Pt.    Personal Factors and Comorbidities Comorbidity 1;Comorbidity 2;Other    Comorbidities Chronic back pain, HTN, GERD, CTR.    Examination-Participation Restrictions Other    Stability/Clinical Decision Making  Stable/Uncomplicated    PT Frequency 2x / week    PT Duration 6 weeks    PT Treatment/Interventions ADLs/Self Care Home Management;Therapeutic activities;Therapeutic exercise;Manual techniques;Patient/family education;Passive range of motion    PT Next Visit Plan Begin with gentle passive range of motion to patient's left shoulder.    Consulted and Agree with Plan of Care Patient             Patient will benefit from skilled therapeutic intervention in order to improve the following deficits and impairments:  Decreased range of motion, Increased edema, Pain, Decreased activity tolerance  Visit Diagnosis: Chronic right shoulder pain  Stiffness of right shoulder, not elsewhere classified  Localized edema     Problem List Patient Active Problem List   Diagnosis Date Noted   Diverticulitis 03/10/2012   Nausea and vomiting 03/10/2012   Hypertension     Bennye Nix,CHRIS, PTA 07/28/2021, 12:18 PM  [facility] [address] [place], [place], [address] Phone: [phone]   Fax:  [phone]  Name: Evora Fleites MRN: [id number] Date of Birth: [birth year]

## 2021-08-04 ENCOUNTER — Encounter: Payer: Medicaid Other | Admitting: *Deleted

## 2022-03-19 ENCOUNTER — Other Ambulatory Visit: Payer: Self-pay | Admitting: Student

## 2022-03-19 DIAGNOSIS — M5416 Radiculopathy, lumbar region: Secondary | ICD-10-CM

## 2022-03-31 ENCOUNTER — Ambulatory Visit
Admission: RE | Admit: 2022-03-31 | Discharge: 2022-03-31 | Disposition: A | Discharge status: Home / Self Care | Payer: Medicaid Other | Source: Ambulatory Visit | Attending: Student | Admitting: Student

## 2022-03-31 DIAGNOSIS — M5416 Radiculopathy, lumbar region: Secondary | ICD-10-CM

## 2022-04-30 ENCOUNTER — Other Ambulatory Visit: Payer: Self-pay | Admitting: Neurosurgery

## 2022-05-31 NOTE — Pre-Procedure Instructions (Signed)
Surgical Instructions    Your procedure is scheduled on June 08, 2022.  Report to [facility] Main Entrance "A" at 6:00 A.M., then check in with the Admitting office.  Call this number if you have problems the morning of surgery:  [phone]   If you have any questions prior to your surgery date call [phone]: Open Monday-Friday 8am-4pm    Remember:  Do not eat or drink after midnight the night before your surgery    Take these medicines the morning of surgery with A SIP OF WATER:  atorvastatin (LIPITOR)   carvedilol (COREG)  estradiol (ESTRACE)  icosapent Ethyl (VASCEPA)  sertraline (ZOLOFT)    Take these medicines the morning of surgery AS NEEDED: methocarbamol (ROBAXIN) Oxycodone HCl   traMADol (ULTRAM)   Follow your surgeon's instructions on when to stop Aspirin.  If no instructions were given by your surgeon then you will need to call the office to get those instructions.    As of today, STOP taking any Aleve, Naproxen, Ibuprofen, Motrin, Advil, Goody's, BC's, all herbal medications, fish oil, and all vitamins.                     Do NOT Smoke (Tobacco/Vaping) for 24 hours prior to your procedure.  If you use a CPAP at night, you may bring your mask/headgear for your overnight stay.   Contacts, glasses, piercing's, hearing aid's, dentures or partials may not be worn into surgery, please bring cases for these belongings.    For patients admitted to the hospital, discharge time will be determined by your treatment team.   Patients discharged the day of surgery will not be allowed to drive home, and someone needs to stay with them for 24 hours.  SURGICAL WAITING ROOM VISITATION Patients having surgery or a procedure may have two support people in the waiting room. These visitors may be switched out with other visitors if needed. Children under the age of 73 must have an adult accompany them who is not the patient. If the patient needs to stay at the hospital during  part of their recovery, the visitor guidelines for inpatient rooms apply.  Please refer to the [facility] website for the visitor guidelines for Inpatients (after your surgery is over and you are in a regular room).    Special instructions:   [facility]- Preparing For Surgery  Before surgery, you can play an important role. Because skin is not sterile, your skin needs to be as free of germs as possible. You can reduce the number of germs on your skin by washing with CHG (chlorahexidine gluconate) Soap before surgery.  CHG is an antiseptic cleaner which kills germs and bonds with the skin to continue killing germs even after washing.    Oral Hygiene is also important to reduce your risk of infection.  Remember - BRUSH YOUR TEETH THE MORNING OF SURGERY WITH YOUR REGULAR TOOTHPASTE  Please do not use if you have an allergy to CHG or antibacterial soaps. If your skin becomes reddened/irritated stop using the CHG.  Do not shave (including legs and underarms) for at least 48 hours prior to first CHG shower. It is OK to shave your face.  Please follow these instructions carefully.   Shower the NIGHT BEFORE SURGERY and the MORNING OF SURGERY  If you chose to wash your hair, wash your hair first as usual with your normal shampoo.  After you shampoo, rinse your hair and body thoroughly to remove the shampoo.  Use CHG Soap as you would any other liquid soap. You can apply CHG directly to the skin and wash gently with a scrungie or a clean washcloth.   Apply the CHG Soap to your body ONLY FROM THE NECK DOWN.  Do not use on open wounds or open sores. Avoid contact with your eyes, ears, mouth and genitals (private parts). Wash Face and genitals (private parts)  with your normal soap.   Wash thoroughly, paying special attention to the area where your surgery will be performed.  Thoroughly rinse your body with warm water from the neck down.  DO NOT shower/wash with your normal soap after using and  rinsing off the CHG Soap.  Pat yourself dry with a CLEAN TOWEL.  Wear CLEAN PAJAMAS to bed the night before surgery  Place CLEAN SHEETS on your bed the night before your surgery  DO NOT SLEEP WITH PETS.   Day of Surgery: Take a shower with CHG soap.  Do not wear jewelry or makeup Do not wear lotions, powders, perfumes/colognes, or deodorant. Do not shave 48 hours prior to surgery.  Men may shave face and neck. Do not bring valuables to the hospital.  [facility] is not responsible for any belongings or valuables. Do not wear nail polish, gel polish, artificial nails, or any other type of covering on natural nails (fingers and toes) If you have artificial nails or gel coating that need to be removed by a nail salon, please have this removed prior to surgery. Artificial nails or gel coating may interfere with anesthesia's ability to adequately monitor your vital signs.  Wear Clean/Comfortable clothing the morning of surgery  Remember to brush your teeth WITH YOUR REGULAR TOOTHPASTE.   Please read over the following fact sheets that you were given.    If you received a COVID test during your pre-op visit  it is requested that you wear a mask when out in public, stay away from anyone that may not be feeling well and notify your surgeon if you develop symptoms. If you have been in contact with anyone that has tested positive in the last 10 days please notify you surgeon.

## 2022-06-01 ENCOUNTER — Encounter (HOSPITAL_COMMUNITY)
Admission: RE | Admit: 2022-06-01 | Discharge: 2022-06-01 | Disposition: A | Discharge status: Home / Self Care | Payer: Medicaid Other | Source: Ambulatory Visit | Attending: Neurosurgery | Admitting: Neurosurgery

## 2022-06-01 ENCOUNTER — Encounter (HOSPITAL_COMMUNITY): Payer: Self-pay

## 2022-06-01 ENCOUNTER — Other Ambulatory Visit: Payer: Self-pay

## 2022-06-01 VITALS — BP 140/80 | HR 81 | Temp 98.0°F | Resp 18 | Ht 65.0 in | Wt 234.1 lb

## 2022-06-01 DIAGNOSIS — Z6835 Body mass index (BMI) 35.0-35.9, adult: Secondary | ICD-10-CM | POA: Insufficient documentation

## 2022-06-01 DIAGNOSIS — R0600 Dyspnea, unspecified: Secondary | ICD-10-CM | POA: Diagnosis not present

## 2022-06-01 DIAGNOSIS — Z01812 Encounter for preprocedural laboratory examination: Secondary | ICD-10-CM | POA: Diagnosis present

## 2022-06-01 DIAGNOSIS — I493 Ventricular premature depolarization: Secondary | ICD-10-CM | POA: Insufficient documentation

## 2022-06-01 DIAGNOSIS — E785 Hyperlipidemia, unspecified: Secondary | ICD-10-CM | POA: Diagnosis not present

## 2022-06-01 DIAGNOSIS — I10 Essential (primary) hypertension: Secondary | ICD-10-CM | POA: Diagnosis not present

## 2022-06-01 DIAGNOSIS — E669 Obesity, unspecified: Secondary | ICD-10-CM | POA: Insufficient documentation

## 2022-06-01 DIAGNOSIS — I2510 Atherosclerotic heart disease of native coronary artery without angina pectoris: Secondary | ICD-10-CM | POA: Insufficient documentation

## 2022-06-01 DIAGNOSIS — Z01818 Encounter for other preprocedural examination: Secondary | ICD-10-CM

## 2022-06-01 LAB — CBC
HCT: 37.4 % (ref 36.0–46.0)
Hemoglobin: 12.1 g/dL (ref 12.0–15.0)
MCH: 27.2 pg (ref 26.0–34.0)
MCHC: 32.4 g/dL (ref 30.0–36.0)
MCV: 84 fL (ref 80.0–100.0)
Platelets: 237 10*3/uL (ref 150–400)
RBC: 4.45 MIL/uL (ref 3.87–5.11)
RDW: 13.4 % (ref 11.5–15.5)
WBC: 6.3 10*3/uL (ref 4.0–10.5)
nRBC: 0 % (ref 0.0–0.2)

## 2022-06-01 LAB — BASIC METABOLIC PANEL
Anion gap: 10 (ref 5–15)
BUN: 11 mg/dL (ref 6–20)
CO2: 25 mmol/L (ref 22–32)
Calcium: 8.6 mg/dL — ABNORMAL LOW (ref 8.9–10.3)
Chloride: 103 mmol/L (ref 98–111)
Creatinine, Ser: 0.63 mg/dL (ref 0.44–1.00)
GFR, Estimated: >60 mL/min (ref >60)
Glucose, Bld: 148 mg/dL — ABNORMAL HIGH (ref 70–99)
Potassium: 3.9 mmol/L (ref 3.5–5.1)
Sodium: 138 mmol/L (ref 135–145)

## 2022-06-01 LAB — SURGICAL PCR SCREEN
MRSA, PCR: NEGATIVE
Staphylococcus aureus: NEGATIVE

## 2022-06-01 NOTE — Progress Notes (Signed)
PCP - [facility] of [facility] Dr. Lester [place] Cardiologist - Notes in CE from [facility] mentioning Dr. De Hollingshead and Dr. Leroy Libman  PPM/ICD - Denies  Chest x-ray - NI EKG - REquesting EKG tracing from [facility]  Stress Test - "I think they done the dye" ECHO - 03/28/21 Cardiac Cath - 09/29/21  Sleep Study - Denies  DM - Prediabetic  Aspirin Instructions: Requested that the patient call Dr. Lindalou Hose office for instruction on when to stop  Anesthesia review: Yes cardiac history  Patient denies shortness of breath, fever, cough and chest pain at PAT appointment   All instructions explained to the patient, with a verbal understanding of the material. Patient agrees to go over the instructions while at home for a better understanding. The opportunity to ask questions was provided.

## 2022-06-04 NOTE — Anesthesia Preprocedure Evaluation (Addendum)
Anesthesia Evaluation  Patient identified by MRN, date of birth, ID band Patient awake    Reviewed: Allergy & Precautions, NPO status , Patient's Chart, lab work & pertinent test results  History of Anesthesia Complications (+) history of anesthetic complications  Airway Mallampati: III  TM Distance: >3 FB Neck ROM: Full  Mouth opening: Limited Mouth Opening  Dental no notable dental hx. (+) Dental Advisory Given   Pulmonary shortness of breath,    Pulmonary exam normal breath sounds clear to auscultation       Cardiovascular hypertension, + CAD  Normal cardiovascular exam Rhythm:Regular Rate:Normal     Neuro/Psych Seizures -, Well Controlled,  PSYCHIATRIC DISORDERS Anxiety Depression    GI/Hepatic Neg liver ROS, GERD  ,  Endo/Other  negative endocrine ROS  Renal/GU negative Renal ROS     Musculoskeletal negative musculoskeletal ROS (+)   Abdominal (+) + obese,   Peds  Hematology negative hematology ROS (+)   Anesthesia Other Findings   Reproductive/Obstetrics negative OB ROS                          Anesthesia Physical Anesthesia Plan  ASA: 3  Anesthesia Plan: General   Post-op Pain Management: Tylenol PO (pre-op)* and Gabapentin PO (pre-op)*   Induction: Intravenous  PONV Risk Score and Plan: 3 and Ondansetron, Dexamethasone, Treatment may vary due to age or medical condition and Midazolam  Airway Management Planned: Oral ETT  Additional Equipment:   Intra-op Plan:   Post-operative Plan: Extubation in OR  Informed Consent: I have reviewed the patients History and Physical, chart, labs and discussed the procedure including the risks, benefits and alternatives for the proposed anesthesia with the patient or authorized representative who has indicated his/her understanding and acceptance.     Dental advisory given  Plan Discussed with: CRNA  Anesthesia Plan Comments: (PAT  note by Karoline Caldwell, PA-C: Follows with cardiology for history of HTN, HLD, obesity.  Recently underwent thorough cardiac evaluation for symptoms of DOE.  Echocardiogram 03/2021 showed preserved LVEF.  Nuclear stress 03/2021 was positive inducible ischemia. She subsequently underwent coronary angiography with mild nonobstructive CAD. She continued to have dyspnea on exertion and underwent a right heart catheterization with exercise to look for exercise-induced myocardial relaxation abnormality.  This showed normal hemodynamics.  She was last seen by Dr. Kimber Relic 03/20/2022.  At that time patient complained of dizziness and a 7-day Holter monitor was ordered.  Monitor showed 1 episode of sinus tachycardia at 155 to 169 bpm lasting 10 minutes 49 seconds.  PVC burden 0.21%.  PSVC burden <0.01%.  No evidence of A-fib, sustained VT, >3-second pauses, or high degree AV block.  Not diagnosed with OSA, however STOP-BANG score of 5 indicates increased risk.  Preop labs reviewed, unremarkable.  EKG 03/09/2022 (copy on chart): Sinus tachycardia.  Rate 110.  Event monitor 04/09/2022 (Care Everywhere): Findings: The predominant rhythm is NSR   Impression:  1 day auto-trigger event monitor demonstrating 1 triggeredevent correlating with sinus tachycardia @ 155-169bpm lasting 10 min 49seconds  PVC burden 0.21%  PSVC burden <0.01%   No evidence of A.fib, sustained VT, >3 sec pause or high degree AVB   Cath with exercise 09/29/2021 (Care Everywhere): Conclusion:  No significant increase in wedge pressure with exercise.  No pulmonary hypertension   Recommendations:  Continue risk factor modifications.  Cath 04/10/2021 (Care Everywhere): CONCLUSIONS:  1. Mild nonobstructive coronary artery disease  TTE 03/28/2021 (Care Everywhere): Left Ventricle  Left  ventricle size is normal. Wall thickness is normal. EF: 55-60%. Wall motion is normal. Doppler parameters indicate normal diastolic function.   Right Ventricle   Right ventricle is normal. Systolic function is normal.   Left Atrium  Left atrium is normal in size.   Right Atrium  Normal sized right atrium.   Mitral Valve  Mitral valve structure is normal. There is trace regurgitation.   Tricuspid Valve  Tricuspid valve structure is normal. There is trace regurgitation. Unable to assess RVSP due to incomplete Doppler signal.   Aortic Valve  The aortic valve is tricuspid. The leaflets exhibit normal excursion. There is mild sclerosis. There is no regurgitation or stenosis.   Pulmonic Valve  The pulmonic valve was not well visualized. No regurgitation present on the pulmonic valve   Ascending Aorta  The aortic root is normal in size.   Pericardium  There is no pericardial effusion.  )      Anesthesia Quick Evaluation

## 2022-06-04 NOTE — Progress Notes (Signed)
Anesthesia Chart Review:  Follows with cardiology for history of HTN, HLD, obesity.  Recently underwent thorough cardiac evaluation for symptoms of DOE.  Echocardiogram 03/2021 showed preserved LVEF.  Nuclear stress 03/2021 was positive inducible ischemia. [name] subsequently underwent coronary angiography with mild nonobstructive CAD. [name] continued to have dyspnea on exertion and underwent a right heart catheterization with exercise to look for exercise-induced myocardial relaxation abnormality.  This showed normal hemodynamics.  [name] was last seen by Dr. De Hollingshead 03/20/2022.  At that time patient complained of dizziness and a 7-day Holter monitor was ordered.  Monitor showed 1 episode of sinus tachycardia at 155 to 169 bpm lasting 10 minutes 49 seconds.  PVC burden 0.21%.  PSVC burden <0.01%.  No evidence of A-fib, sustained VT, >3-second pauses, or high degree AV block.  Not diagnosed with OSA, however STOP-BANG score of 5 indicates increased risk.  Preop labs reviewed, unremarkable.  EKG 03/09/2022 (copy on chart): Sinus tachycardia.  Rate 110.  Event monitor 04/09/2022 (Care Everywhere): Findings: The predominant rhythm is NSR   Impression:  1 day auto-trigger event monitor demonstrating 1 triggered event correlating with sinus tachycardia @ 155-169bpm lasting 10 min 49seconds  PVC burden 0.21%  PSVC burden <0.01%   No evidence of A.fib, sustained VT, >3 sec pause or high degree AVB   Cath with exercise 09/29/2021 (Care Everywhere): Conclusion:  No significant increase in wedge pressure with exercise.  No pulmonary hypertension   Recommendations:  Continue risk factor modifications.  Cath 04/10/2021 (Care Everywhere): CONCLUSIONS:  1. Mild nonobstructive coronary artery disease  TTE 03/28/2021 (Care Everywhere): Left Ventricle  Left ventricle size is normal. Wall thickness is normal. EF: 55-60%. Wall motion is normal. Doppler parameters indicate normal diastolic function.   Right Ventricle   Right ventricle is normal. Systolic function is normal.   Left Atrium  Left atrium is normal in size.   Right Atrium  Normal sized right atrium.   Mitral Valve  Mitral valve structure is normal. There is trace regurgitation.   Tricuspid Valve  Tricuspid valve structure is normal. There is trace regurgitation. Unable to assess RVSP due to incomplete Doppler signal.   Aortic Valve  The aortic valve is tricuspid. The leaflets exhibit normal excursion. There is mild sclerosis. There is no regurgitation or stenosis.   Pulmonic Valve  The pulmonic valve was not well visualized. No regurgitation present on the pulmonic valve   Ascending Aorta  The aortic root is normal in size.   Pericardium  There is no pericardial effusion.      Zannie Cove [facility] Short Stay Center/Anesthesiology Phone [phone] 06/04/2022 4:06 PM

## 2022-06-08 ENCOUNTER — Observation Stay (HOSPITAL_COMMUNITY)
Admission: RE | Admit: 2022-06-08 | Discharge: 2022-06-09 | Disposition: A | Discharge status: Home / Self Care | Payer: Medicaid Other | Source: Ambulatory Visit | Attending: Neurosurgery | Admitting: Neurosurgery

## 2022-06-08 ENCOUNTER — Ambulatory Visit (HOSPITAL_COMMUNITY): Payer: Medicaid Other

## 2022-06-08 ENCOUNTER — Other Ambulatory Visit: Payer: Self-pay

## 2022-06-08 ENCOUNTER — Encounter (HOSPITAL_COMMUNITY): Payer: Self-pay | Admitting: Neurosurgery

## 2022-06-08 ENCOUNTER — Encounter (HOSPITAL_COMMUNITY)
Admission: RE | Disposition: A | Discharge status: Home / Self Care | Payer: Self-pay | Source: Ambulatory Visit | Attending: Neurosurgery

## 2022-06-08 ENCOUNTER — Ambulatory Visit (HOSPITAL_COMMUNITY): Payer: Medicaid Other | Admitting: Physician Assistant

## 2022-06-08 ENCOUNTER — Ambulatory Visit (HOSPITAL_BASED_OUTPATIENT_CLINIC_OR_DEPARTMENT_OTHER): Payer: Medicaid Other | Admitting: Anesthesiology

## 2022-06-08 DIAGNOSIS — I10 Essential (primary) hypertension: Secondary | ICD-10-CM | POA: Diagnosis not present

## 2022-06-08 DIAGNOSIS — I2510 Atherosclerotic heart disease of native coronary artery without angina pectoris: Secondary | ICD-10-CM

## 2022-06-08 DIAGNOSIS — Z79899 Other long term (current) drug therapy: Secondary | ICD-10-CM | POA: Insufficient documentation

## 2022-06-08 DIAGNOSIS — M5416 Radiculopathy, lumbar region: Secondary | ICD-10-CM | POA: Diagnosis present

## 2022-06-08 DIAGNOSIS — M4726 Other spondylosis with radiculopathy, lumbar region: Secondary | ICD-10-CM | POA: Diagnosis not present

## 2022-06-08 DIAGNOSIS — F418 Other specified anxiety disorders: Secondary | ICD-10-CM

## 2022-06-08 DIAGNOSIS — Z7982 Long term (current) use of aspirin: Secondary | ICD-10-CM | POA: Insufficient documentation

## 2022-06-08 DIAGNOSIS — M48061 Spinal stenosis, lumbar region without neurogenic claudication: Secondary | ICD-10-CM | POA: Diagnosis present

## 2022-06-08 DIAGNOSIS — M4727 Other spondylosis with radiculopathy, lumbosacral region: Secondary | ICD-10-CM

## 2022-06-08 HISTORY — DX: Unspecified convulsions: R56.9

## 2022-06-08 HISTORY — PX: LUMBAR LAMINECTOMY/DECOMPRESSION MICRODISCECTOMY: SHX5026

## 2022-06-08 HISTORY — DX: Prediabetes: R73.03

## 2022-06-08 LAB — GLUCOSE, CAPILLARY: Glucose-Capillary: 136 mg/dL — ABNORMAL HIGH (ref 70–99)

## 2022-06-08 SURGERY — LUMBAR LAMINECTOMY/DECOMPRESSION MICRODISCECTOMY 2 LEVELS
Anesthesia: General | Site: Spine Lumbar | Laterality: Left

## 2022-06-08 MED ORDER — PANTOPRAZOLE SODIUM 40 MG PO TBEC
40.0000 mg | DELAYED_RELEASE_TABLET | Freq: Every day | ORAL | Status: DC
  Administered 2022-06-08 – 2022-06-09 (×2): 40 mg via ORAL
  Filled 2022-06-08 (×2): qty 1

## 2022-06-08 MED ORDER — CHLORHEXIDINE GLUCONATE CLOTH 2 % EX PADS: 6.0000 | MEDICATED_PAD | Freq: Once | CUTANEOUS | Status: DC

## 2022-06-08 MED ORDER — LAMOTRIGINE 25 MG PO TABS
25.0000 mg | ORAL_TABLET | Freq: Two times a day (BID) | ORAL | Status: DC
  Administered 2022-06-08: 25 mg via ORAL
  Filled 2022-06-08 (×3): qty 1

## 2022-06-08 MED ORDER — ROCURONIUM BROMIDE 10 MG/ML (PF) SYRINGE
PREFILLED_SYRINGE | INTRAVENOUS | Status: DC | PRN
  Administered 2022-06-08: 50 mg via INTRAVENOUS
  Administered 2022-06-08: 30 mg via INTRAVENOUS

## 2022-06-08 MED ORDER — ATORVASTATIN CALCIUM 80 MG PO TABS
80.0000 mg | ORAL_TABLET | Freq: Every day | ORAL | Status: DC
  Administered 2022-06-08: 80 mg via ORAL
  Filled 2022-06-08: qty 1

## 2022-06-08 MED ORDER — PHENYLEPHRINE 80 MCG/ML (10ML) SYRINGE FOR IV PUSH (FOR BLOOD PRESSURE SUPPORT)
PREFILLED_SYRINGE | INTRAVENOUS | Status: DC | PRN
  Administered 2022-06-08: 80 ug via INTRAVENOUS
  Administered 2022-06-08: 160 ug via INTRAVENOUS

## 2022-06-08 MED ORDER — LIDOCAINE 2% (20 MG/ML) 5 ML SYRINGE
INTRAMUSCULAR | Status: AC
  Filled 2022-06-08: qty 5

## 2022-06-08 MED ORDER — DEXAMETHASONE SODIUM PHOSPHATE 10 MG/ML IJ SOLN
INTRAMUSCULAR | Status: AC
  Filled 2022-06-08: qty 1

## 2022-06-08 MED ORDER — PHENOL 1.4 % MT LIQD: 1.0000 | OROMUCOSAL | Status: DC | PRN

## 2022-06-08 MED ORDER — ONDANSETRON HCL 4 MG/2ML IJ SOLN
INTRAMUSCULAR | Status: AC
  Filled 2022-06-08: qty 2

## 2022-06-08 MED ORDER — KETOROLAC TROMETHAMINE 30 MG/ML IJ SOLN
INTRAMUSCULAR | Status: DC | PRN
  Administered 2022-06-08: 30 mg via INTRAVENOUS

## 2022-06-08 MED ORDER — MONTELUKAST SODIUM 10 MG PO TABS
10.0000 mg | ORAL_TABLET | Freq: Every day | ORAL | Status: DC
  Administered 2022-06-08: 10 mg via ORAL
  Filled 2022-06-08: qty 1

## 2022-06-08 MED ORDER — HYDROXYZINE HCL 25 MG PO TABS
25.0000 mg | ORAL_TABLET | Freq: Every day | ORAL | Status: DC
  Administered 2022-06-08: 25 mg via ORAL
  Filled 2022-06-08: qty 1

## 2022-06-08 MED ORDER — OXYCODONE HCL 5 MG PO TABS: 5.0000 mg | ORAL_TABLET | Freq: Once | ORAL | Status: DC | PRN

## 2022-06-08 MED ORDER — VITAMIN B-12 1000 MCG PO TABS
1000.0000 ug | ORAL_TABLET | Freq: Every day | ORAL | Status: DC
Start: 2022-06-08 — End: 2022-06-09
  Administered 2022-06-08: 1000 ug via ORAL
  Filled 2022-06-08: qty 1

## 2022-06-08 MED ORDER — VITAMIN D (ERGOCALCIFEROL) 1.25 MG (50000 UNIT) PO CAPS
50000.0000 [IU] | ORAL_CAPSULE | ORAL | Status: DC
  Filled 2022-06-08: qty 1

## 2022-06-08 MED ORDER — MIDAZOLAM HCL 2 MG/2ML IJ SOLN
INTRAMUSCULAR | Status: AC
  Filled 2022-06-08: qty 2

## 2022-06-08 MED ORDER — PROPOFOL 10 MG/ML IV BOLUS
INTRAVENOUS | Status: AC
  Filled 2022-06-08: qty 20

## 2022-06-08 MED ORDER — ACETAMINOPHEN 500 MG PO TABS: 1000.0000 mg | ORAL_TABLET | Freq: Once | ORAL | Status: AC

## 2022-06-08 MED ORDER — THROMBIN 20000 UNITS EX SOLR: CUTANEOUS | Status: DC | PRN

## 2022-06-08 MED ORDER — FENTANYL CITRATE (PF) 100 MCG/2ML IJ SOLN
INTRAMUSCULAR | Status: AC
  Filled 2022-06-08: qty 2

## 2022-06-08 MED ORDER — OXYCODONE HCL 5 MG PO TABS
20.0000 mg | ORAL_TABLET | ORAL | Status: DC | PRN
  Administered 2022-06-08 – 2022-06-09 (×5): 20 mg via ORAL
  Filled 2022-06-08 (×5): qty 4

## 2022-06-08 MED ORDER — LIDOCAINE 2% (20 MG/ML) 5 ML SYRINGE
INTRAMUSCULAR | Status: DC | PRN
  Administered 2022-06-08: 80 mg via INTRAVENOUS

## 2022-06-08 MED ORDER — METHOCARBAMOL 500 MG PO TABS: 500.0000 mg | ORAL_TABLET | Freq: Three times a day (TID) | ORAL | Status: DC | PRN

## 2022-06-08 MED ORDER — ACETAMINOPHEN 325 MG PO TABS: 650.0000 mg | ORAL_TABLET | ORAL | Status: DC | PRN

## 2022-06-08 MED ORDER — ACETAMINOPHEN 500 MG PO TABS
ORAL_TABLET | ORAL | Status: AC
  Administered 2022-06-08: 1000 mg via ORAL
  Filled 2022-06-08: qty 2

## 2022-06-08 MED ORDER — ALBUMIN HUMAN 5 % IV SOLN: INTRAVENOUS | Status: DC | PRN

## 2022-06-08 MED ORDER — LISINOPRIL-HYDROCHLOROTHIAZIDE 10-12.5 MG PO TABS: 1.0000 | ORAL_TABLET | Freq: Every day | ORAL | Status: DC

## 2022-06-08 MED ORDER — ROCURONIUM BROMIDE 10 MG/ML (PF) SYRINGE
PREFILLED_SYRINGE | INTRAVENOUS | Status: AC
  Filled 2022-06-08: qty 10

## 2022-06-08 MED ORDER — HYDROCODONE-ACETAMINOPHEN 10-325 MG PO TABS: 1.0000 | ORAL_TABLET | ORAL | Status: DC | PRN

## 2022-06-08 MED ORDER — CYCLOSPORINE 0.05 % OP EMUL
1.0000 [drp] | Freq: Every day | OPHTHALMIC | Status: DC
Start: 2022-06-08 — End: 2022-06-09
  Filled 2022-06-08 (×2): qty 30

## 2022-06-08 MED ORDER — BUPIVACAINE HCL (PF) 0.25 % IJ SOLN
INTRAMUSCULAR | Status: DC | PRN
  Administered 2022-06-08: 20 mL

## 2022-06-08 MED ORDER — SUCCINYLCHOLINE CHLORIDE 200 MG/10ML IV SOSY
PREFILLED_SYRINGE | INTRAVENOUS | Status: AC
  Filled 2022-06-08: qty 10

## 2022-06-08 MED ORDER — KETOROLAC TROMETHAMINE 15 MG/ML IJ SOLN
30.0000 mg | Freq: Four times a day (QID) | INTRAMUSCULAR | Status: AC
  Administered 2022-06-08 – 2022-06-09 (×4): 30 mg via INTRAVENOUS
  Filled 2022-06-08 (×4): qty 2

## 2022-06-08 MED ORDER — FENTANYL CITRATE (PF) 250 MCG/5ML IJ SOLN
INTRAMUSCULAR | Status: DC | PRN
  Administered 2022-06-08: 100 ug via INTRAVENOUS
  Administered 2022-06-08: 50 ug via INTRAVENOUS

## 2022-06-08 MED ORDER — MORPHINE SULFATE (PF) 2 MG/ML IV SOLN: 2.0000 mg | INTRAVENOUS | Status: DC | PRN

## 2022-06-08 MED ORDER — GABAPENTIN 300 MG PO CAPS: 300.0000 mg | ORAL_CAPSULE | Freq: Once | ORAL | Status: AC

## 2022-06-08 MED ORDER — ICOSAPENT ETHYL 1 G PO CAPS
1.0000 g | ORAL_CAPSULE | Freq: Two times a day (BID) | ORAL | Status: DC
  Administered 2022-06-08: 1 g via ORAL
  Filled 2022-06-08: qty 1

## 2022-06-08 MED ORDER — HYDROCODONE-ACETAMINOPHEN 5-325 MG PO TABS: 1.0000 | ORAL_TABLET | ORAL | Status: DC | PRN

## 2022-06-08 MED ORDER — METHYLPREDNISOLONE ACETATE 80 MG/ML IJ SUSP
INTRAMUSCULAR | Status: AC
  Filled 2022-06-08: qty 1

## 2022-06-08 MED ORDER — DEXAMETHASONE SODIUM PHOSPHATE 10 MG/ML IJ SOLN
INTRAMUSCULAR | Status: DC | PRN
  Administered 2022-06-08: 10 mg via INTRAVENOUS

## 2022-06-08 MED ORDER — PROMETHAZINE HCL 25 MG/ML IJ SOLN: 6.2500 mg | INTRAMUSCULAR | Status: DC | PRN

## 2022-06-08 MED ORDER — CHLORHEXIDINE GLUCONATE 0.12 % MT SOLN
OROMUCOSAL | Status: AC
  Administered 2022-06-08: 15 mL via OROMUCOSAL
  Filled 2022-06-08: qty 15

## 2022-06-08 MED ORDER — GABAPENTIN 300 MG PO CAPS
ORAL_CAPSULE | ORAL | Status: AC
  Administered 2022-06-08: 300 mg via ORAL
  Filled 2022-06-08: qty 1

## 2022-06-08 MED ORDER — SODIUM CHLORIDE 0.9 % IV SOLN
250.0000 mL | INTRAVENOUS | Status: DC
Start: 2022-06-08 — End: 2022-06-09
  Administered 2022-06-08: 250 mL via INTRAVENOUS

## 2022-06-08 MED ORDER — LACTATED RINGERS IV SOLN: INTRAVENOUS | Status: DC

## 2022-06-08 MED ORDER — CHLORHEXIDINE GLUCONATE 0.12 % MT SOLN: 15.0000 mL | Freq: Once | OROMUCOSAL | Status: AC

## 2022-06-08 MED ORDER — THROMBIN 20000 UNITS EX SOLR
CUTANEOUS | Status: AC
  Filled 2022-06-08: qty 20000

## 2022-06-08 MED ORDER — CEFAZOLIN SODIUM-DEXTROSE 2-4 GM/100ML-% IV SOLN
2.0000 g | INTRAVENOUS | Status: AC
  Administered 2022-06-08: 2 g via INTRAVENOUS

## 2022-06-08 MED ORDER — LISINOPRIL 10 MG PO TABS
10.0000 mg | ORAL_TABLET | Freq: Every day | ORAL | Status: DC
  Administered 2022-06-08: 10 mg via ORAL
  Filled 2022-06-08: qty 1

## 2022-06-08 MED ORDER — CEFAZOLIN SODIUM-DEXTROSE 1-4 GM/50ML-% IV SOLN
1.0000 g | Freq: Three times a day (TID) | INTRAVENOUS | Status: AC
  Administered 2022-06-08 – 2022-06-09 (×2): 1 g via INTRAVENOUS
  Filled 2022-06-08 (×2): qty 50

## 2022-06-08 MED ORDER — KETOROLAC TROMETHAMINE 30 MG/ML IJ SOLN
INTRAMUSCULAR | Status: AC
  Filled 2022-06-08: qty 1

## 2022-06-08 MED ORDER — OXYCODONE HCL 5 MG/5ML PO SOLN: 5.0000 mg | Freq: Once | ORAL | Status: DC | PRN

## 2022-06-08 MED ORDER — 0.9 % SODIUM CHLORIDE (POUR BTL) OPTIME
TOPICAL | Status: DC | PRN
  Administered 2022-06-08: 1000 mL

## 2022-06-08 MED ORDER — SERTRALINE HCL 50 MG PO TABS
100.0000 mg | ORAL_TABLET | Freq: Two times a day (BID) | ORAL | Status: DC
  Administered 2022-06-08: 100 mg via ORAL
  Filled 2022-06-08: qty 2

## 2022-06-08 MED ORDER — CEFAZOLIN SODIUM-DEXTROSE 2-4 GM/100ML-% IV SOLN
INTRAVENOUS | Status: AC
  Filled 2022-06-08: qty 100

## 2022-06-08 MED ORDER — ONDANSETRON HCL 4 MG PO TABS: 4.0000 mg | ORAL_TABLET | Freq: Four times a day (QID) | ORAL | Status: DC | PRN

## 2022-06-08 MED ORDER — FENTANYL CITRATE (PF) 100 MCG/2ML IJ SOLN
25.0000 ug | INTRAMUSCULAR | Status: DC | PRN
  Administered 2022-06-08 (×2): 25 ug via INTRAVENOUS

## 2022-06-08 MED ORDER — MEPERIDINE HCL 25 MG/ML IJ SOLN: 6.2500 mg | INTRAMUSCULAR | Status: DC | PRN

## 2022-06-08 MED ORDER — CYCLOBENZAPRINE HCL 10 MG PO TABS
10.0000 mg | ORAL_TABLET | Freq: Three times a day (TID) | ORAL | Status: DC | PRN
  Administered 2022-06-08: 10 mg via ORAL
  Filled 2022-06-08 (×2): qty 1

## 2022-06-08 MED ORDER — MIDAZOLAM HCL 2 MG/2ML IJ SOLN
INTRAMUSCULAR | Status: DC | PRN
  Administered 2022-06-08: 2 mg via INTRAVENOUS

## 2022-06-08 MED ORDER — HYDROCHLOROTHIAZIDE 12.5 MG PO TABS
12.5000 mg | ORAL_TABLET | Freq: Every day | ORAL | Status: DC
  Administered 2022-06-08: 12.5 mg via ORAL
  Filled 2022-06-08: qty 1

## 2022-06-08 MED ORDER — FENTANYL CITRATE (PF) 250 MCG/5ML IJ SOLN
INTRAMUSCULAR | Status: AC
  Filled 2022-06-08: qty 5

## 2022-06-08 MED ORDER — ESTRADIOL 1 MG PO TABS
1.0000 mg | ORAL_TABLET | Freq: Every day | ORAL | Status: DC
  Administered 2022-06-08: 1 mg via ORAL
  Filled 2022-06-08 (×2): qty 1

## 2022-06-08 MED ORDER — SODIUM CHLORIDE 0.9% FLUSH: 3.0000 mL | INTRAVENOUS | Status: DC | PRN

## 2022-06-08 MED ORDER — ORAL CARE MOUTH RINSE: 15.0000 mL | Freq: Once | OROMUCOSAL | Status: AC

## 2022-06-08 MED ORDER — EPHEDRINE SULFATE-NACL 50-0.9 MG/10ML-% IV SOSY
PREFILLED_SYRINGE | INTRAVENOUS | Status: DC | PRN
  Administered 2022-06-08 (×2): 5 mg via INTRAVENOUS

## 2022-06-08 MED ORDER — ACETAMINOPHEN 650 MG RE SUPP: 650.0000 mg | RECTAL | Status: DC | PRN

## 2022-06-08 MED ORDER — CARVEDILOL 6.25 MG PO TABS
6.2500 mg | ORAL_TABLET | Freq: Two times a day (BID) | ORAL | Status: DC
  Administered 2022-06-08: 6.25 mg via ORAL
  Filled 2022-06-08: qty 1

## 2022-06-08 MED ORDER — ASPIRIN 81 MG PO TBEC
81.0000 mg | DELAYED_RELEASE_TABLET | Freq: Every day | ORAL | Status: DC
  Administered 2022-06-09: 81 mg via ORAL
  Filled 2022-06-08: qty 1

## 2022-06-08 MED ORDER — PHENYLEPHRINE HCL-NACL 20-0.9 MG/250ML-% IV SOLN
INTRAVENOUS | Status: DC | PRN
  Administered 2022-06-08: 40 ug/min via INTRAVENOUS

## 2022-06-08 MED ORDER — SODIUM CHLORIDE 0.9% FLUSH
3.0000 mL | Freq: Two times a day (BID) | INTRAVENOUS | Status: DC
  Administered 2022-06-08: 3 mL via INTRAVENOUS

## 2022-06-08 MED ORDER — ONDANSETRON HCL 4 MG/2ML IJ SOLN
4.0000 mg | Freq: Four times a day (QID) | INTRAMUSCULAR | Status: DC | PRN
Start: 2022-06-08 — End: 2022-06-09

## 2022-06-08 MED ORDER — TRAMADOL HCL 50 MG PO TABS: 50.0000 mg | ORAL_TABLET | Freq: Four times a day (QID) | ORAL | Status: DC | PRN

## 2022-06-08 MED ORDER — SUGAMMADEX SODIUM 200 MG/2ML IV SOLN
INTRAVENOUS | Status: DC | PRN
  Administered 2022-06-08: 200 mg via INTRAVENOUS

## 2022-06-08 MED ORDER — ONDANSETRON HCL 4 MG/2ML IJ SOLN
INTRAMUSCULAR | Status: DC | PRN
  Administered 2022-06-08: 4 mg via INTRAVENOUS

## 2022-06-08 MED ORDER — MENTHOL 3 MG MT LOZG: 1.0000 | LOZENGE | OROMUCOSAL | Status: DC | PRN

## 2022-06-08 MED ORDER — BUPIVACAINE HCL (PF) 0.25 % IJ SOLN
INTRAMUSCULAR | Status: AC
  Filled 2022-06-08: qty 30

## 2022-06-08 MED ORDER — PROPOFOL 10 MG/ML IV BOLUS
INTRAVENOUS | Status: DC | PRN
  Administered 2022-06-08: 180 mg via INTRAVENOUS

## 2022-06-08 SURGICAL SUPPLY — 48 items
ADH SKN CLS APL DERMABOND .7 (GAUZE/BANDAGES/DRESSINGS) ×1
APL SKNCLS STERI-STRIP NONHPOA (GAUZE/BANDAGES/DRESSINGS) ×1
BAG COUNTER SPONGE SURGICOUNT (BAG) ×3 IMPLANT
BAG SPNG CNTER NS LX DISP (BAG) ×2
BAND INSRT 18 STRL LF DISP RB (MISCELLANEOUS) ×2
BAND RUBBER #18 3X1/16 STRL (MISCELLANEOUS) ×4 IMPLANT
BENZOIN TINCTURE PRP APPL 2/3 (GAUZE/BANDAGES/DRESSINGS) ×2 IMPLANT
BUR CUTTER 7.0 ROUND (BURR) ×2 IMPLANT
CANISTER SUCT 3000ML PPV (MISCELLANEOUS) ×2 IMPLANT
CARTRIDGE OIL MAESTRO DRILL (MISCELLANEOUS) ×1 IMPLANT
DERMABOND ADVANCED (GAUZE/BANDAGES/DRESSINGS) ×1
DERMABOND ADVANCED .7 DNX12 (GAUZE/BANDAGES/DRESSINGS) ×1 IMPLANT
DIFFUSER DRILL AIR PNEUMATIC (MISCELLANEOUS) ×2 IMPLANT
DRAPE HALF SHEET 40X57 (DRAPES) IMPLANT
DRAPE LAPAROTOMY 100X72X124 (DRAPES) ×2 IMPLANT
DRAPE MICROSCOPE LEICA (MISCELLANEOUS) ×2 IMPLANT
DRSG OPSITE POSTOP 4X6 (GAUZE/BANDAGES/DRESSINGS) ×1 IMPLANT
DURAPREP 26ML APPLICATOR (WOUND CARE) ×2 IMPLANT
ELECT REM PT RETURN 9FT ADLT (ELECTROSURGICAL) ×2
ELECTRODE REM PT RTRN 9FT ADLT (ELECTROSURGICAL) ×1 IMPLANT
GLOVE BIO SURGEON STRL SZ 6.5 (GLOVE) ×2 IMPLANT
GLOVE BIOGEL PI IND STRL 6.5 (GLOVE) ×1 IMPLANT
GLOVE BIOGEL PI IND STRL 7.5 (GLOVE) IMPLANT
GLOVE BIOGEL PI INDICATOR 6.5 (GLOVE) ×1
GLOVE BIOGEL PI INDICATOR 7.5 (GLOVE) ×3
GLOVE ECLIPSE 9.0 STRL (GLOVE) ×2 IMPLANT
GLOVE SURG SS PI 7.0 STRL IVOR (GLOVE) ×4 IMPLANT
GOWN STRL REUS W/ TWL LRG LVL3 (GOWN DISPOSABLE) IMPLANT
GOWN STRL REUS W/ TWL XL LVL3 (GOWN DISPOSABLE) ×1 IMPLANT
GOWN STRL REUS W/TWL 2XL LVL3 (GOWN DISPOSABLE) IMPLANT
GOWN STRL REUS W/TWL LRG LVL3 (GOWN DISPOSABLE) ×2
GOWN STRL REUS W/TWL XL LVL3 (GOWN DISPOSABLE) ×6
KIT BASIN OR (CUSTOM PROCEDURE TRAY) ×2 IMPLANT
KIT TURNOVER KIT B (KITS) ×2 IMPLANT
NDL SPNL 22GX3.5 QUINCKE BK (NEEDLE) IMPLANT
NEEDLE HYPO 22GX1.5 SAFETY (NEEDLE) ×2 IMPLANT
NEEDLE SPNL 22GX3.5 QUINCKE BK (NEEDLE) IMPLANT
NS IRRIG 1000ML POUR BTL (IV SOLUTION) ×2 IMPLANT
OIL CARTRIDGE MAESTRO DRILL (MISCELLANEOUS) ×2
PACK LAMINECTOMY NEURO (CUSTOM PROCEDURE TRAY) ×2 IMPLANT
PAD ARMBOARD 7.5X6 YLW CONV (MISCELLANEOUS) ×6 IMPLANT
SPONGE SURGIFOAM ABS GEL 100 (HEMOSTASIS) ×1 IMPLANT
STRIP CLOSURE SKIN 1/2X4 (GAUZE/BANDAGES/DRESSINGS) ×2 IMPLANT
SUT VIC AB 2-0 CT1 18 (SUTURE) ×2 IMPLANT
SUT VIC AB 3-0 SH 8-18 (SUTURE) ×2 IMPLANT
TOWEL GREEN STERILE (TOWEL DISPOSABLE) ×2 IMPLANT
TOWEL GREEN STERILE FF (TOWEL DISPOSABLE) ×2 IMPLANT
WATER STERILE IRR 1000ML POUR (IV SOLUTION) ×2 IMPLANT

## 2022-06-08 NOTE — Transfer of Care (Signed)
Immediate Anesthesia Transfer of Care Note  Patient: [name]  Procedure(s) Performed: Left Lumbar Four-Five, Lumbar Five-Sacral One Laminectomy and Foraminotomywith Left  Lumbar Five-Sacral One Microdiscectomy (Left: Spine Lumbar)  Patient Location: PACU  Anesthesia Type:General  Level of Consciousness: drowsy  Airway & Oxygen Therapy: Patient Spontanous Breathing and Patient connected to face mask oxygen  Post-op Assessment: Report given to RN and Post -op Vital signs reviewed and stable  Post vital signs: Reviewed and stable  Last Vitals:  Vitals Value Taken Time  BP 110/54 06/08/22 1004  Temp    Pulse 89 06/08/22 1009  Resp 15 06/08/22 1009  SpO2 97 % 06/08/22 1009  Vitals shown include unvalidated device data.  Last Pain:  Vitals:   06/08/22 0629  TempSrc:   PainSc: 5          Complications: No notable events documented.

## 2022-06-08 NOTE — H&P (Signed)
[name] is an 56 y.o. female.   Chief Complaint: Left leg pain HPI: 56 year old female with chronic left lower extremity radicular pain failing all conservative management her work-up demonstrates evidence of spondylosis with lateral recess stenosis on the left at L4-5 and spondylosis with stenosis and a superiorly migrated disc fragment on the left at L5-S1.  Patient has failed conservative management presents now for two-level lumbar decompression in hopes of improving her symptoms.  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[place])    Chronic back pain    Chronic, continuous use of opioids    Complication of anesthesia    wakes up during surgery   Coronary artery disease    non-obstr CAD-treat medically   Depression    Diverticulitis    Dyspnea    with exertion   GERD (gastroesophageal reflux disease)    High cholesterol    Hypertension    Pre-diabetes    Seizure ([place])    "on the brain"- pt states she never knows she has them    Past Surgical History:  Procedure Laterality Date   ABDOMINAL HYSTERECTOMY     BRAIN TUMOR EXCISION     CARPAL TUNNEL RELEASE     CHOLECYSTECTOMY     ENDOMETRIAL ABLATION     IRRIGATION AND DEBRIDEMENT SHOULDER Left 06/29/2021   Procedure: IRRIGATION AND DEBRIDEMENT SHOULDER;  Surgeon: Hiram Gash, MD;  Location: [facility];  Service: Orthopedics;  Laterality: Left;  exparel block   RESECTION DISTAL CLAVICAL Left 06/29/2021   Procedure: RESECTION DISTAL CLAVICLE;  Surgeon: Hiram Gash, MD;  Location: [place] [facility];  Service: Orthopedics;  Laterality: Left;  exparel block   SHOULDER ARTHROSCOPY WITH SUBACROMIAL DECOMPRESSION, ROTATOR CUFF REPAIR AND BICEP TENDON REPAIR Left 06/29/2021   Procedure: SHOULDER ARTHROSCOPY WITH SUBACROMIAL DECOMPRESSION, ROTATOR CUFF REPAIR AND BICEP TENDON REPAIR;  Surgeon: Hiram Gash, MD;  Location: [facility];  Service: Orthopedics;   Laterality: Left;  exparel block   TUBAL LIGATION      Family History  Problem Relation Age of Onset   Hypertension Mother    Hyperlipidemia Mother    Hypertension Father    Hyperlipidemia Father    Heart failure Father    Social History:  reports that she has never smoked. She has never used smokeless tobacco. She reports that she does not drink alcohol and does not use drugs.  Allergies:  Allergies  Allergen Reactions   Dilaudid [Hydromorphone Hcl] Anxiety   Phenytoin Sodium Extended Anxiety    Medications Prior to Admission  Medication Sig Dispense Refill   aspirin EC 81 MG tablet Take 81 mg by mouth daily. Swallow whole.     atorvastatin (LIPITOR) 80 MG tablet Take 80 mg by mouth daily.     carvedilol (COREG) 6.25 MG tablet Take 6.25 mg by mouth 2 (two) times daily with a meal.     esomeprazole (NEXIUM) 40 MG capsule Take 40 mg by mouth 2 (two) times daily before a meal.     estradiol (ESTRACE) 1 MG tablet Take 1 mg by mouth daily.     hydrOXYzine (ATARAX) 25 MG tablet Take 25 mg by mouth daily.     icosapent Ethyl (VASCEPA) 1 g capsule Take 1 g by mouth 2 (two) times daily.     lamoTRIgine (LAMICTAL) 25 MG tablet Take 25 mg by mouth 2 (two) times daily.     lisinopril-hydrochlorothiazide (ZESTORETIC) 10-12.5  MG tablet Take 1 tablet by mouth daily.     montelukast (SINGULAIR) 10 MG tablet Take 10 mg by mouth at bedtime.     naloxone (NARCAN) nasal spray 4 mg/0.1 mL 4 mg.     Oxycodone HCl 20 MG TABS Take 1 tablet by mouth every 4 (four) hours as needed (pain from brain surgery).     RESTASIS 0.05 % ophthalmic emulsion Place 1 drop into both eyes daily.     sertraline (ZOLOFT) 100 MG tablet Take 100 mg by mouth 2 (two) times daily.     vitamin B-12 (CYANOCOBALAMIN) 1000 MCG tablet Take 1,000 mcg by mouth daily.     Cholecalciferol (VITAMIN D3) 1.25 MG (50000 UT) CAPS Take 50,000 Units by mouth once a week.     methocarbamol (ROBAXIN) 500 MG tablet Take 1 tablet (500 mg  total) by mouth every 8 (eight) hours as needed for muscle spasms. (Patient not taking: Reported on 05/31/2022) 20 tablet 0   traMADol (ULTRAM) 50 MG tablet Take 1 tablet (50 mg total) by mouth every 6 (six) hours as needed for severe pain. That is not controlled by your daily pain prescription (Patient not taking: Reported on 05/31/2022) 20 tablet 0    Results for orders placed or performed during the hospital encounter of 06/08/22 (from the past 48 hour(s))  Glucose, capillary     Status: Abnormal   Collection Time: 06/08/22  6:07 AM  Result Value Ref Range   Glucose-Capillary 136 (H) 70 - 99 mg/dL    Comment: Glucose reference range applies only to samples taken after fasting for at least 8 hours.   No results found.  Pertinent items noted in HPI and remainder of comprehensive ROS otherwise negative.  Blood pressure 99/63, pulse 79, temperature 98.1 F (36.7 C), temperature source Oral, resp. rate 18, height '5\' 5"'$  (1.651 m), weight 60.8 kg, SpO2 95 %.  Patient is awake and alert.  She is oriented and appropriate.  Speech is fluent.  Judgment insight are intact.  Cranial nerve function normal bilaterally motor examination with some mild weakness of dorsiflexion on the left side otherwise motor strength intact sensor examination decree sensation pinprick light touch in her left L5 dermatome.  Deep intermix is normal active except her Achilles reflexes are diminished bilaterally.  No evidence of long track signs.  Gait antalgic.  Posture recently normal.  Examination head ears eyes nose and throat is unremarkable her chest and abdomen are benign.  Extremities are free from injury deformity. Assessment/Plan Left L4-5 spondylosis with stenosis and radiculopathy, left L5-S1 spondylosis and disc herniation with radiculopathy.  Plan left L4-5 and left L5-S1 decompressive laminotomies with possible left L5-S1 microdiscectomy.  Risks and benefits been explained.  Patient wishes to proceed.  Cooper Render  Zilla Shartzer 06/08/2022, 7:51 AM

## 2022-06-08 NOTE — Anesthesia Postprocedure Evaluation (Signed)
Anesthesia Post Note  Patient: [name]  Procedure(s) Performed: Left Lumbar Four-Five, Lumbar Five-Sacral One Laminectomy and Foraminotomywith Left  Lumbar Five-Sacral One Microdiscectomy (Left: Spine Lumbar)     Patient location during evaluation: PACU Anesthesia Type: General Level of consciousness: sedated and patient cooperative Pain management: pain level controlled Vital Signs Assessment: post-procedure vital signs reviewed and stable Respiratory status: spontaneous breathing Cardiovascular status: stable Anesthetic complications: no   No notable events documented.  Last Vitals:  Vitals:   06/08/22 1105 06/08/22 1120  BP: 110/67 123/71  Pulse: 83 81  Resp: 17 18  Temp: 36.7 C   SpO2: 96% 97%    Last Pain:  Vitals:   06/08/22 1035  TempSrc:   PainSc: [place]

## 2022-06-08 NOTE — Brief Op Note (Signed)
06/08/2022  9:49 AM  PATIENT:  [name]  56 y.o. female  PRE-OPERATIVE DIAGNOSIS:  Radiculopathy  POST-OPERATIVE DIAGNOSIS:  Radiculopathy  PROCEDURE:  Procedure(s): Left Lumbar Four-Five, Lumbar Five-Sacral One Laminectomy and Foraminotomywith Left  Lumbar Five-Sacral One Microdiscectomy (Left)  SURGEON:  Surgeon(s) and Role:    Earnie Larsson, MD - Primary  PHYSICIAN ASSISTANT:   ASSISTANTSMearl Latin   ANESTHESIA:   general  EBL:  150cc   BLOOD ADMINISTERED:none  DRAINS: none   LOCAL MEDICATIONS USED:  MARCAINE     SPECIMEN:  No Specimen  DISPOSITION OF SPECIMEN:  N/A  COUNTS:  YES  TOURNIQUET:  * No tourniquets in log *  DICTATION: .Dragon Dictation  PLAN OF CARE: Admit for overnight observation  PATIENT DISPOSITION:  PACU - hemodynamically stable.   Delay start of Pharmacological VTE agent (>24hrs) due to surgical blood loss or risk of bleeding: yes

## 2022-06-08 NOTE — Anesthesia Procedure Notes (Signed)
Procedure Name: Intubation Date/Time: 06/08/2022 8:17 AM  Performed by: Lorie Phenix, CRNAPre-anesthesia Checklist: Patient identified, Emergency Drugs available, Suction available and Patient being monitored Patient Re-evaluated:Patient Re-evaluated prior to induction Oxygen Delivery Method: Circle System Utilized Preoxygenation: Pre-oxygenation with 100% oxygen Induction Type: IV induction Ventilation: Mask ventilation without difficulty Laryngoscope Size: Mac and 3 Grade View: Grade I Tube type: Oral Tube size: 7.0 mm Number of attempts: 1 Airway Equipment and Method: Stylet and Oral airway Placement Confirmation: ETT inserted through vocal cords under direct vision, positive ETCO2 and breath sounds checked- equal and bilateral Secured at: 22 cm Tube secured with: Tape Dental Injury: Teeth and Oropharynx as per pre-operative assessment  Comments: Lauren Cozart, SRNA placed ETT under supervision.

## 2022-06-08 NOTE — Progress Notes (Signed)
   Providing Compassionate, Quality Care - Together   Subjective: Patient reports postoperative back pain.  Objective: Vital signs in last 24 hours: Temp:  [98 F (36.7 C)-98.1 F (36.7 C)] 98 F (36.7 C) (06/30 1105) Pulse Rate:  [72-92] 81 (06/30 1120) Resp:  [14-18] 18 (06/30 1120) BP: (99-123)/(54-71) 123/71 (06/30 1120) SpO2:  [95 %-97 %] 97 % (06/30 1120) Weight:  [60.8 kg] 60.8 kg (06/30 0605)  Intake/Output from previous day: No intake/output data recorded. Intake/Output this shift: Total I/O In: 1050 [I.V.:800; IV Piggyback:250] Out: 300 [Blood:300]  Alert and oriented x 4 PERRLA CN II-XII grossly intact MAE, Strength and sensation intact Incision is covered with Honeycomb dressing and Steri Strips; Dressing is clean, dry, and intact   Lab Results: No results for input(s): "WBC", "HGB", "HCT", "PLT" in the last 72 hours. BMET No results for input(s): "NA", "K", "CL", "CO2", "GLUCOSE", "BUN", "CREATININE", "CALCIUM" in the last 72 hours.  Studies/Results: DG Lumbar Spine 1 View  Result Date: 06/08/2022 CLINICAL DATA:  56 year old female undergoing lumbar surgery. EXAM: LUMBAR SPINE - 1 VIEW COMPARISON:  Lumbar MRI 03/31/2022. FINDINGS: Intraoperative portable cross-table lateral view of the lumbar spine at 0846 hours. Lumbar segmentation appears to be normal on this image, concordant with the MRI numbering in April. Posterior instrument localization at the L5-S1 level. IMPRESSION: Intraoperative localization at L5-S1. Electronically Signed   By: Genevie Ann M.D.   On: 06/08/2022 10:40    Assessment/Plan: Patient underwent a left L4-5 decompressive laminotomy and foraminotomies, with left L5-S1 decompressive laminotomy/foraminotomies and left L5 microdiscectomy by Dr. Annette Stable on 06/08/2022. She would like to stay overnight and discharge home in the morning.   LOS: 0 days     Viona Gilmore, DNP, AGNP-C Nurse Practitioner  [facility] [place] [address], [place], [place] [address] P: [phone]    F: [phone]  06/08/2022, 4:34 PM

## 2022-06-08 NOTE — Op Note (Signed)
Date of procedure: 06/08/2022  Date of dictation: Same  Service: Neurosurgery  Preoperative diagnosis: Left L4-5 and L5-S1 spondylosis with radiculopathy  Postoperative diagnosis: Same  Procedure Name: Left L4-5 decompressive laminotomy and foraminotomies.  Left L5-S1 decompressive laminotomy with foraminotomies and left L5 microdiscectomy  Surgeon:Kay Shippy A.Zyheir Daft, M.D.  Asst. Surgeon: Reinaldo Meeker, NP  Anesthesia: General  Indication: 56 year old female with left lower extremity radicular pain primarily consistent with a left-sided L5 radiculopathy which is failed conservative management.  Work-up demonstrates evidence of spondylosis with lateral recess stenosis on the left at L4-5 and some foraminal stenosis and a small superiorly migrated disc herniation on the left at L5-S1.  Patient presents now for two-level lumbar decompression in hopes improving her symptoms.  Operative note: After induction of anesthesia, patient position prone onto Wilson frame and properly padded.  Lumbar region prepped and draped sterilely.  Incision made over L5.  Dissection performed on the left.  Retractor placed.  X-ray taken in the L5-S1 level was confirmed.  Laminotomy was then performed at L4-5 and L5-S1 to remove the inferior aspect lamina of L4 medial aspect the L4-5 facet joint and the superior rim of the L5 lamina as well as the inferior aspect the L5 lamina medial aspect of the L5-S1 facet joint and superior rim of the S1 lamina.  Ligament flavum elevated and resected.  Foraminotomies completed on the course the exiting L4-L5 and S1 nerve roots on the left side.  Microscope brought in field used microdissection.  First L4-5 the interspace was examined.  The disc was mildly bulging but there is no evidence of any residual stenosis or disc herniation.  L5-S1 there is a small amount of superiorly migrated disc fragment which was removed the disc base itself was otherwise bulging but not frankly torn.  Wound was then  irrigated.  Gelfoam was placed topically for hemostasis.  Wounds then closed in layers with Vicryl sutures.  Steri-Strips and sterile dressing were applied.  No apparent complications.  Patient tolerated the procedure well and she returns to the recovery room postop.

## 2022-06-08 NOTE — Evaluation (Signed)
Occupational Therapy Evaluation Patient Details Name: [name] MRN: [id number] DOB: September 24, 1966 Today's Date: 06/08/2022   History of Present Illness 56 yo F post PLIF L 4-L5.  PMH includes:Anxiety      Benign tumor of brain ([facility])     Chronic back pain     Chronic, continuous use of opioids     Complication of anesthesia      wakes up during surgery   Coronary artery disease      non-obstr CAD-treat medically   Depression     Diverticulitis     Dyspnea      with exertion   GERD (gastroesophageal reflux disease)     High cholesterol     Hypertension     Pre-diabetes     Seizure.   Clinical Impression   Patient admitted for the diagnosis above.  PTA she lives at home with her spouse.  Patient was able to walk without an AD, performed her own self care, and could participate with meal prep and light home management.  She admits to being limited by back pain.  Currently, she expresses post op pain, and is needing up to [place] for lower body ADL and generalized supervision for mobility at a RW level.  OT feels patient could return home with the level of assist she has, encourage mobility throughout the day.  Education sheet left and reviewed with the patient.  She verbalizes understanding.  All questions answered, if the patient remains overnight, OT can follow up in the morning.         Recommendations for follow up therapy are one component of a multi-disciplinary discharge planning process, led by the attending physician.  Recommendations may be updated based on patient status, additional functional criteria and insurance authorization.   Follow Up Recommendations  No OT follow up    Assistance Recommended at Discharge Intermittent Supervision/Assistance  Patient can return home with the following A little help with bathing/dressing/bathroom;Help with stairs or ramp for entrance;Assist for transportation;Assistance with cooking/housework    Functional Status Assessment  Patient has had a  recent decline in their functional status and demonstrates the ability to make significant improvements in function in a reasonable and predictable amount of time.  Equipment Recommendations  BSC/3in1;Other (comment) (2 wheel rolling walker)    Recommendations for Other Services       Precautions / Restrictions Precautions Precautions: Back Precaution Booklet Issued: Yes (comment) Precaution Comments: verbalized understanding Restrictions Weight Bearing Restrictions: No      Mobility Bed Mobility Overal bed mobility: Needs Assistance Bed Mobility: Sidelying to Sit, Sit to Sidelying   Sidelying to sit: Min guard     Sit to sidelying: Min assist      Transfers Overall transfer level: Needs assistance Equipment used: Rolling walker (2 wheels) Transfers: Sit to/from Stand Sit to Stand: Supervision                  Balance Overall balance assessment: Needs assistance Sitting-balance support: Feet supported Sitting balance-Leahy Scale: Good     Standing balance support: Reliant on assistive device for balance Standing balance-Leahy Scale: Fair                             ADL either performed or assessed with clinical judgement   ADL                   Upper Body Dressing : Set up;Sitting  Lower Body Dressing: Minimal assistance;Sit to/from stand   Toilet Transfer: Min guard;Rolling walker (2 wheels);Regular Toilet                   Vision Patient Visual Report: No change from baseline       Perception Perception Perception: Not tested   Praxis Praxis Praxis: Not tested    Pertinent Vitals/Pain Pain Assessment Pain Assessment: Faces Faces Pain Scale: Hurts even more Pain Location: R side of her back Pain Descriptors / Indicators: Stabbing, Tender, Grimacing, Guarding, Sharp Pain Intervention(s): Monitored during session     Hand Dominance Right   Extremity/Trunk Assessment Upper Extremity Assessment Upper  Extremity Assessment: Overall WFL for tasks assessed   Lower Extremity Assessment Lower Extremity Assessment: Generalized weakness   Cervical / Trunk Assessment Cervical / Trunk Assessment: Back Surgery   Communication Communication Communication: No difficulties   Cognition Arousal/Alertness: Awake/alert Behavior During Therapy: WFL for tasks assessed/performed Overall Cognitive Status: Within Functional Limits for tasks assessed                                       General Comments   VSS on RA    Exercises     Shoulder Instructions      Home Living Family/patient expects to be discharged to:: Private residence Living Arrangements: Spouse/significant other;Children;Other relatives Available Help at Discharge: Family;Available 24 hours/day Type of Home: House Home Access: Stairs to enter [employer] of Steps: 4 Entrance Stairs-Rails: Can reach both;Left;Right Home Layout: One level     Bathroom Shower/Tub: Tub/shower unit;Walk-in shower   Bathroom Toilet: Standard Bathroom Accessibility: Yes How Accessible: Accessible via walker Home Equipment: None          Prior Functioning/Environment Prior Level of Function : Independent/Modified Independent;Driving                        OT Problem List: Pain      OT Treatment/Interventions: Self-care/ADL training;Therapeutic activities;Patient/family education;DME and/or AE instruction    OT Goals(Current goals can be found in the care plan section) Acute Rehab OT Goals Patient Stated Goal: Would like to go home today OT Goal Formulation: With patient Time For Goal Achievement: 06/15/22 Potential to Achieve Goals: Good ADL Goals Pt Will Perform Lower Body Dressing: with set-up;sit to/from stand Pt Will Transfer to Toilet: with modified independence;ambulating;regular height toilet  OT Frequency: Min 2X/week    Co-evaluation              AM-PAC OT "6 Clicks" Daily  Activity     Outcome Measure Help from another person eating meals?: None Help from another person taking care of personal grooming?: None Help from another person toileting, which includes using toliet, bedpan, or urinal?: A Little Help from another person bathing (including washing, rinsing, drying)?: A Little Help from another person to put on and taking off regular upper body clothing?: None Help from another person to put on and taking off regular lower body clothing?: A Little 6 Click Score: 21   End of Session Equipment Utilized During Treatment: Rolling walker (2 wheels) Nurse Communication: Mobility status  Activity Tolerance: Patient tolerated treatment well Patient left: in bed;with call bell/phone within reach  OT Visit Diagnosis: Pain Pain - Right/Left: Right Pain - part of body: Leg                Time: 1610-9604  OT Time Calculation (min): 20 min Charges:  OT General Charges $OT Visit: 1 Visit OT Evaluation $OT Eval Moderate Complexity: 1 Mod  06/08/2022  RP, OTR/L  Acute Rehabilitation Services  Office:  [phone]   Metta Clines 06/08/2022, 2:28 PM

## 2022-06-09 ENCOUNTER — Encounter (HOSPITAL_COMMUNITY): Payer: Self-pay | Admitting: Neurosurgery

## 2022-06-09 DIAGNOSIS — M48061 Spinal stenosis, lumbar region without neurogenic claudication: Secondary | ICD-10-CM | POA: Diagnosis not present

## 2022-06-09 MED ORDER — METHOCARBAMOL 750 MG PO TABS: 750.0000 mg | ORAL_TABLET | Freq: Four times a day (QID) | ORAL | 3 refills | Status: AC | PRN

## 2022-06-09 NOTE — Progress Notes (Signed)
Occupational Therapy Treatment Patient Details Name: [name] MRN: [id number] DOB: [birth year] Today's Date: 06/09/2022   History of present illness 56 yo F post PLIF L 4-L5.  PMH includes:Anxiety      Benign tumor of brain (HCC)     Chronic back pain     Chronic, continuous use of opioids     Complication of anesthesia      wakes up during surgery   Coronary artery disease      non-obstr CAD-treat medically   Depression     Diverticulitis     Dyspnea      with exertion   GERD (gastroesophageal reflux disease)     High cholesterol     Hypertension     Pre-diabetes     Seizure.   OT comments  Patient remains sore this am, but is walking better, and able to complete 3 stairs in preparation to return home.  Patient able to complete dressing this am prior to OT seeing her.  Patient with good follow through of all back precautions, and is ready for discharge.  She will have the needed assist at home, and no post acute OT anticipated.  Recommend follow up with MD as prescribed.     Recommendations for follow up therapy are one component of a multi-disciplinary discharge planning process, led by the attending physician.  Recommendations may be updated based on patient status, additional functional criteria and insurance authorization.    Follow Up Recommendations  No OT follow up    Assistance Recommended at Discharge Set up Supervision/Assistance  Patient can return home with the following  Help with stairs or ramp for entrance;Assistance with cooking/housework;Assist for transportation   Equipment Recommendations       Recommendations for Other Services      Precautions / Restrictions Precautions Precautions: Back Restrictions Weight Bearing Restrictions: No       Mobility Bed Mobility Overal bed mobility: Modified Independent                  Transfers Overall transfer level: Modified independent Equipment used: Rolling walker (2 wheels)                      Balance   Sitting-balance support: Feet supported Sitting balance-Leahy Scale: Good     Standing balance support: Reliant on assistive device for balance Standing balance-Leahy Scale: Fair                             ADL either performed or assessed with clinical judgement   ADL                   Upper Body Dressing : Set up;Sitting   Lower Body Dressing: Supervision/safety;Sit to/from stand   Toilet Transfer: Supervision/safety;Rolling walker (2 wheels)                  Extremity/Trunk Assessment Upper Extremity Assessment Upper Extremity Assessment: Overall WFL for tasks assessed   Lower Extremity Assessment Lower Extremity Assessment: Overall WFL for tasks assessed   Cervical / Trunk Assessment Cervical / Trunk Assessment: Back Surgery                      Cognition Arousal/Alertness: Awake/alert Behavior During Therapy: WFL for tasks assessed/performed Overall Cognitive Status: Within Functional Limits for tasks assessed  General Comments  VSS on RA    Pertinent Vitals/ Pain       Pain Assessment Pain Assessment: Faces Faces Pain Scale: Hurts little more Pain Location: R side of her back Pain Descriptors / Indicators: Grimacing, Guarding, Sore, Tender Pain Intervention(s): Monitored during session, Premedicated before session                                                          Frequency  Min 2X/week        Progress Toward Goals  OT Goals(current goals can now be found in the care plan section)  Progress towards OT goals: Progressing toward goals  Acute Rehab OT Goals OT Goal Formulation: All assessment and education complete, DC therapy  Plan Discharge plan remains appropriate;All goals met and education completed, patient discharged from OT services    Co-evaluation                 AM-PAC OT "6  Clicks" Daily Activity     Outcome Measure   Help from another person eating meals?: None Help from another person taking care of personal grooming?: None Help from another person toileting, which includes using toliet, bedpan, or urinal?: A Little Help from another person bathing (including washing, rinsing, drying)?: A Little Help from another person to put on and taking off regular upper body clothing?: None Help from another person to put on and taking off regular lower body clothing?: A Little 6 Click Score: 21    End of Session Equipment Utilized During Treatment: Rolling walker (2 wheels)  OT Visit Diagnosis: Pain Pain - Right/Left: Right Pain - part of body: Leg   Activity Tolerance Patient tolerated treatment well   Patient Left in chair;with call bell/phone within reach   Nurse Communication          Time: 7014-1030 OT Time Calculation (min): 15 min  Charges: OT General Charges $OT Visit: 1 Visit OT Treatments $Self Care/Home Management : 8-22 mins  06/09/2022  RP, OTR/L  Acute Rehabilitation Services  Office:  [phone]   Metta Clines 06/09/2022, 9:02 AM

## 2022-06-09 NOTE — Discharge Summary (Signed)
Physician Discharge Summary  Patient ID: [name] MRN: [id number] DOB/AGE: 1966-07-15 56 y.o.  Admit date: 06/08/2022 Discharge date: 06/09/2022  Admission Diagnoses: Left L4-5 and L5-S1 spondylosis with radiculopathy   Discharge Diagnoses: Left L4-5 and L5-S1 spondylosis with radiculopathy   Principal Problem:   Lumbar radiculopathy   Discharged Condition: good   Hospital Course: The patient was admitted on 06/08/2022 and taken to the operating room where the patient underwent left L4-5 and left L5-S1 decompressive laminotomies with left L5-S1 microdiscectomy. The patient tolerated the procedure well and was taken to the recovery room and then to the floor in stable condition. The hospital course was routine. There were no complications. The wound remained clean dry and intact. Pt had appropriate back soreness. No complaints of leg pain or new N/T/W. The patient remained afebrile with stable vital signs, and tolerated a regular diet. The patient continued to increase activities, and pain was well controlled with oral pain medications.   Consults: None  Significant Diagnostic Studies: radiology: X-Ray: intraoperative   Treatments: surgery: Left L4-5 decompressive laminotomy and foraminotomies.  Left L5-S1 decompressive laminotomy with foraminotomies and left L5 microdiscectomy  Discharge Exam: Blood pressure (!) 121/55, pulse 98, temperature 97.8 F (36.6 C), temperature source Oral, resp. rate 16, height _0  (1.651 m), weight 60.8 kg, SpO2 98 %. Physical Exam: Patient is awake, A/O X 4, conversant, and in good spirits. Eyes open spontaneously. They are in NAD and VSS. Doing well. Speech is fluent and appropriate. MAEW with good strength that is symmetric bilaterally.  BUE 5/5 throughout, BLE 5/5 throughout. Sensation to light touch is intact. PERLA, EOMI. CNs grossly intact.   Dressing is clean dry intact. Incision is well approximated with no drainage, erythema, or  edema.  Hemovac with approximately XX ml of serous / Serosanguineous / sanguineous  drainage.  LSO brace in place / Hard cervical collar in place  BUE 5/5 throughout, BLE 5/5 except for dorsiflexion 4/5     Disposition: Discharge disposition: 01-Home or Self Care        Allergies as of 06/09/2022       Reactions   Dilaudid [hydromorphone Hcl] Anxiety   Phenytoin Sodium Extended Anxiety        Medication List     STOP taking these medications    traMADol 50 MG tablet Commonly known as: Ultram       TAKE these medications    aspirin EC 81 MG tablet Take 81 mg by mouth daily. Swallow whole.   atorvastatin 80 MG tablet Commonly known as: LIPITOR Take 80 mg by mouth daily.   carvedilol 6.25 MG tablet Commonly known as: COREG Take 6.25 mg by mouth 2 (two) times daily with a meal.   esomeprazole 40 MG capsule Commonly known as: NEXIUM Take 40 mg by mouth 2 (two) times daily before a meal.   estradiol 1 MG tablet Commonly known as: ESTRACE Take 1 mg by mouth daily.   hydrOXYzine 25 MG tablet Commonly known as: ATARAX Take 25 mg by mouth daily.   icosapent Ethyl 1 g capsule Commonly known as: VASCEPA Take 1 g by mouth 2 (two) times daily.   lamoTRIgine 25 MG tablet Commonly known as: LAMICTAL Take 25 mg by mouth 2 (two) times daily.   lisinopril-hydrochlorothiazide 10-12.5 MG tablet Commonly known as: ZESTORETIC Take 1 tablet by mouth daily.   methocarbamol 750 MG tablet Commonly known as: Robaxin-750 Take 1 tablet (750 mg total) by mouth every 6 (six) hours as needed  for muscle spasms. What changed:  medication strength how much to take when to take this   montelukast 10 MG tablet Commonly known as: SINGULAIR Take 10 mg by mouth at bedtime.   Narcan 4 MG/0.1ML Liqd nasal spray kit Generic drug: naloxone 4 mg.   Oxycodone HCl 20 MG Tabs Take 1 tablet by mouth every 4 (four) hours as needed (pain from brain surgery).   Restasis 0.05 %  ophthalmic emulsion Generic drug: cycloSPORINE Place 1 drop into both eyes daily.   sertraline 100 MG tablet Commonly known as: ZOLOFT Take 100 mg by mouth 2 (two) times daily.   vitamin B-12 1000 MCG tablet Commonly known as: CYANOCOBALAMIN Take 1,000 mcg by mouth daily.   Vitamin D3 1.25 MG (50000 UT) Caps Take 50,000 Units by mouth once a week.               Durable Medical Equipment  (From admission, onward)           Start     Ordered   06/09/22 0845  For home use only DME Walker rolling  Once       Question Answer Comment  Walker: With [place] Wheels   Patient needs a walker to treat with the following condition Unsteady gait      06/09/22 0844   06/09/22 0845  For home use only DME 3 n 1  Once        06/09/22 0844             Signed: Marvis Moeller, DNP, AGNP-C Neurosurgery Nurse Practitioner  [facility] [address], [place], [place] [address] P: [phone]    F: [phone]  06/09/2022 9:00 AM

## 2022-06-09 NOTE — Discharge Instructions (Signed)
Wound Care Keep incision covered and dry for three days.   Do not put any creams, lotions, or ointments on incision. Leave steri-strips on back.  They will fall off by themselves. Activity Walk each and every day, increasing distance each day. No lifting greater than 5 lbs.  Avoid excessive neck motion. No driving for 2 weeks; may ride as a passenger locally. Diet Resume your normal diet.   Call Your Doctor If Any of These Occur Redness, drainage, or swelling at the wound.  Temperature greater than 101 degrees. Severe pain not relieved by pain medication. Incision starts to come apart. Follow Up Appt Call today for appointment in 1-2 weeks ([phone]) or for problems.  If you have any hardware placed in your spine, you will need an x-ray before your appointment.

## 2022-06-09 NOTE — Progress Notes (Signed)
Patient alert and oriented, voiding adequately, skin clean, dry and intact without evidence of skin break down, or symptoms of complications - no redness or edema noted, only slight tenderness at site.  Patient states pain is manageable at time of discharge. Patient has an appointment with MD in 2 weeks 

## 2022-06-10 ENCOUNTER — Inpatient Hospital Stay (HOSPITAL_COMMUNITY)
Admission: EM | Admit: 2022-06-10 | Discharge: 2022-06-13 | DRG: 552 | Disposition: A | Discharge status: Home / Self Care | Payer: Medicaid Other | Attending: Neurological Surgery | Admitting: Neurological Surgery

## 2022-06-10 ENCOUNTER — Encounter (HOSPITAL_COMMUNITY): Payer: Self-pay

## 2022-06-10 ENCOUNTER — Other Ambulatory Visit: Payer: Self-pay

## 2022-06-10 DIAGNOSIS — Z888 Allergy status to other drugs, medicaments and biological substances status: Secondary | ICD-10-CM | POA: Diagnosis not present

## 2022-06-10 DIAGNOSIS — I2510 Atherosclerotic heart disease of native coronary artery without angina pectoris: Secondary | ICD-10-CM | POA: Diagnosis present

## 2022-06-10 DIAGNOSIS — Z86011 Personal history of benign neoplasm of the brain: Secondary | ICD-10-CM | POA: Diagnosis not present

## 2022-06-10 DIAGNOSIS — Z7982 Long term (current) use of aspirin: Secondary | ICD-10-CM

## 2022-06-10 DIAGNOSIS — F32A Depression, unspecified: Secondary | ICD-10-CM | POA: Diagnosis present

## 2022-06-10 DIAGNOSIS — R339 Retention of urine, unspecified: Secondary | ICD-10-CM | POA: Diagnosis present

## 2022-06-10 DIAGNOSIS — F419 Anxiety disorder, unspecified: Secondary | ICD-10-CM | POA: Diagnosis present

## 2022-06-10 DIAGNOSIS — G8918 Other acute postprocedural pain: Secondary | ICD-10-CM | POA: Diagnosis present

## 2022-06-10 DIAGNOSIS — Z9049 Acquired absence of other specified parts of digestive tract: Secondary | ICD-10-CM

## 2022-06-10 DIAGNOSIS — M5416 Radiculopathy, lumbar region: Principal | ICD-10-CM

## 2022-06-10 DIAGNOSIS — Z8249 Family history of ischemic heart disease and other diseases of the circulatory system: Secondary | ICD-10-CM

## 2022-06-10 DIAGNOSIS — E7800 Pure hypercholesterolemia, unspecified: Secondary | ICD-10-CM | POA: Diagnosis present

## 2022-06-10 DIAGNOSIS — Z9851 Tubal ligation status: Secondary | ICD-10-CM | POA: Diagnosis not present

## 2022-06-10 DIAGNOSIS — Z79899 Other long term (current) drug therapy: Secondary | ICD-10-CM | POA: Diagnosis not present

## 2022-06-10 DIAGNOSIS — R569 Unspecified convulsions: Secondary | ICD-10-CM | POA: Diagnosis present

## 2022-06-10 DIAGNOSIS — K219 Gastro-esophageal reflux disease without esophagitis: Secondary | ICD-10-CM | POA: Diagnosis present

## 2022-06-10 DIAGNOSIS — I10 Essential (primary) hypertension: Secondary | ICD-10-CM | POA: Diagnosis present

## 2022-06-10 DIAGNOSIS — Z885 Allergy status to narcotic agent status: Secondary | ICD-10-CM | POA: Diagnosis not present

## 2022-06-10 DIAGNOSIS — Z90710 Acquired absence of both cervix and uterus: Secondary | ICD-10-CM

## 2022-06-10 LAB — CBC WITH DIFFERENTIAL/PLATELET
Abs Immature Granulocytes: 0.06 10*3/uL (ref 0.00–0.07)
Abs Immature Granulocytes: 0.06 10*3/uL (ref 0.00–0.07)
Basophils Absolute: 0.1 10*3/uL (ref 0.0–0.1)
Basophils Absolute: 0.1 10*3/uL (ref 0.0–0.1)
Basophils Relative: 1 %
Basophils Relative: 1 %
Eosinophils Absolute: 0.2 10*3/uL (ref 0.0–0.5)
Eosinophils Absolute: 0.1 10*3/uL (ref 0.0–0.5)
Eosinophils Relative: 2 %
Eosinophils Relative: 1 %
HCT: 30.9 % — ABNORMAL LOW (ref 36.0–46.0)
HCT: 31.3 % — ABNORMAL LOW (ref 36.0–46.0)
Hemoglobin: 10.2 g/dL — ABNORMAL LOW (ref 12.0–15.0)
Hemoglobin: 9.9 g/dL — ABNORMAL LOW (ref 12.0–15.0)
Immature Granulocytes: 1 %
Immature Granulocytes: 1 %
Lymphocytes Relative: 24 %
Lymphocytes Relative: 21 %
Lymphs Abs: 2.4 10*3/uL (ref 0.7–4.0)
Lymphs Abs: 1.8 10*3/uL (ref 0.7–4.0)
MCH: 28.2 pg (ref 26.0–34.0)
MCH: 27 pg (ref 26.0–34.0)
MCHC: 33 g/dL (ref 30.0–36.0)
MCHC: 31.6 g/dL (ref 30.0–36.0)
MCV: 85.4 fL (ref 80.0–100.0)
MCV: 85.5 fL (ref 80.0–100.0)
Monocytes Absolute: 0.8 10*3/uL (ref 0.1–1.0)
Monocytes Absolute: 0.4 10*3/uL (ref 0.1–1.0)
Monocytes Relative: 8 %
Monocytes Relative: 4 %
Neutro Abs: 6.6 10*3/uL (ref 1.7–7.7)
Neutro Abs: 6.3 10*3/uL (ref 1.7–7.7)
Neutrophils Relative %: 64 %
Neutrophils Relative %: 72 %
Platelets: 187 10*3/uL (ref 150–400)
Platelets: 189 10*3/uL (ref 150–400)
RBC: 3.62 MIL/uL — ABNORMAL LOW (ref 3.87–5.11)
RBC: 3.66 MIL/uL — ABNORMAL LOW (ref 3.87–5.11)
RDW: 13.6 % (ref 11.5–15.5)
RDW: 13.7 % (ref 11.5–15.5)
WBC: 10.1 10*3/uL (ref 4.0–10.5)
WBC: 8.7 10*3/uL (ref 4.0–10.5)
nRBC: 0 % (ref 0.0–0.2)
nRBC: 0 % (ref 0.0–0.2)

## 2022-06-10 LAB — BASIC METABOLIC PANEL
Anion gap: 12 (ref 5–15)
BUN: 12 mg/dL (ref 6–20)
CO2: 27 mmol/L (ref 22–32)
Calcium: 8.6 mg/dL — ABNORMAL LOW (ref 8.9–10.3)
Chloride: 100 mmol/L (ref 98–111)
Creatinine, Ser: 0.69 mg/dL (ref 0.44–1.00)
GFR, Estimated: >60 mL/min (ref >60)
Glucose, Bld: 102 mg/dL — ABNORMAL HIGH (ref 70–99)
Potassium: 4.3 mmol/L (ref 3.5–5.1)
Sodium: 139 mmol/L (ref 135–145)

## 2022-06-10 MED ORDER — FENTANYL CITRATE PF 50 MCG/ML IJ SOSY
100.0000 ug | PREFILLED_SYRINGE | Freq: Once | INTRAMUSCULAR | Status: AC
  Administered 2022-06-10: 100 ug via INTRAVENOUS
  Filled 2022-06-10: qty 2

## 2022-06-10 MED ORDER — METHYLPREDNISOLONE 4 MG PO TBPK
ORAL_TABLET | ORAL | 0 refills | Status: AC
  Filled 2022-06-13: qty 21, 6d supply, fill #0

## 2022-06-10 MED ORDER — FLEET ENEMA 7-19 GM/118ML RE ENEM
1.0000 | ENEMA | Freq: Once | RECTAL | Status: AC | PRN
  Administered 2022-06-11: 1 via RECTAL
  Filled 2022-06-10: qty 1

## 2022-06-10 MED ORDER — FENTANYL CITRATE PF 50 MCG/ML IJ SOSY: 50.0000 ug | PREFILLED_SYRINGE | Freq: Once | INTRAMUSCULAR | Status: DC

## 2022-06-10 MED ORDER — KETOROLAC TROMETHAMINE 15 MG/ML IJ SOLN
15.0000 mg | Freq: Four times a day (QID) | INTRAMUSCULAR | Status: DC | PRN
Start: 2022-06-10 — End: 2022-06-13
  Administered 2022-06-10 – 2022-06-11 (×3): 15 mg via INTRAVENOUS
  Filled 2022-06-10 (×3): qty 1

## 2022-06-10 MED ORDER — FENTANYL CITRATE PF 50 MCG/ML IJ SOSY
100.0000 ug | PREFILLED_SYRINGE | Freq: Once | INTRAMUSCULAR | Status: DC
  Filled 2022-06-10: qty 2

## 2022-06-10 MED ORDER — OXYCODONE HCL 5 MG PO TABS: 20.0000 mg | ORAL_TABLET | ORAL | Status: DC | PRN

## 2022-06-10 MED ORDER — DEXAMETHASONE SODIUM PHOSPHATE 4 MG/ML IJ SOLN
4.0000 mg | Freq: Four times a day (QID) | INTRAMUSCULAR | Status: DC
Start: 2022-06-10 — End: 2022-06-10

## 2022-06-10 MED ORDER — SENNA 8.6 MG PO TABS
1.0000 | ORAL_TABLET | Freq: Two times a day (BID) | ORAL | Status: DC
Start: 2022-06-10 — End: 2022-06-13
  Administered 2022-06-10 – 2022-06-13 (×6): 8.6 mg via ORAL
  Filled 2022-06-10 (×6): qty 1

## 2022-06-10 MED ORDER — ONDANSETRON HCL 4 MG/2ML IJ SOLN: 4.0000 mg | Freq: Four times a day (QID) | INTRAMUSCULAR | Status: DC | PRN

## 2022-06-10 MED ORDER — POLYETHYLENE GLYCOL 3350 17 G PO PACK: 17.0000 g | PACK | Freq: Every day | ORAL | Status: DC | PRN

## 2022-06-10 MED ORDER — GABAPENTIN 300 MG PO CAPS
ORAL_CAPSULE | ORAL | 0 refills | Status: AC
  Filled 2022-06-13: qty 30, 12d supply, fill #0

## 2022-06-10 MED ORDER — DOCUSATE SODIUM 100 MG PO CAPS
100.0000 mg | ORAL_CAPSULE | Freq: Two times a day (BID) | ORAL | Status: DC
  Administered 2022-06-10 – 2022-06-13 (×6): 100 mg via ORAL
  Filled 2022-06-10 (×6): qty 1

## 2022-06-10 MED ORDER — BISACODYL 10 MG RE SUPP: 10.0000 mg | Freq: Every day | RECTAL | Status: DC | PRN

## 2022-06-10 MED ORDER — ACETAMINOPHEN 650 MG RE SUPP: 650.0000 mg | Freq: Four times a day (QID) | RECTAL | Status: DC | PRN

## 2022-06-10 MED ORDER — SODIUM CHLORIDE 0.9% FLUSH
3.0000 mL | Freq: Two times a day (BID) | INTRAVENOUS | Status: DC
  Administered 2022-06-11 – 2022-06-12 (×3): 3 mL via INTRAVENOUS

## 2022-06-10 MED ORDER — SODIUM CHLORIDE 0.9 % IV SOLN: INTRAVENOUS | Status: DC

## 2022-06-10 MED ORDER — ONDANSETRON HCL 4 MG PO TABS: 4.0000 mg | ORAL_TABLET | Freq: Four times a day (QID) | ORAL | Status: DC | PRN

## 2022-06-10 MED ORDER — METHOCARBAMOL 1000 MG/10ML IJ SOLN
1000.0000 mg | Freq: Four times a day (QID) | INTRAMUSCULAR | Status: DC | PRN
Start: 2022-06-10 — End: 2022-06-13
  Administered 2022-06-11 (×2): 1000 mg via INTRAVENOUS
  Filled 2022-06-10 (×3): qty 10

## 2022-06-10 MED ORDER — OXYCODONE HCL 5 MG PO TABS
20.0000 mg | ORAL_TABLET | ORAL | Status: DC
  Administered 2022-06-10 – 2022-06-13 (×16): 20 mg via ORAL
  Filled 2022-06-10 (×17): qty 4

## 2022-06-10 MED ORDER — GABAPENTIN 300 MG PO CAPS
300.0000 mg | ORAL_CAPSULE | Freq: Three times a day (TID) | ORAL | Status: DC
  Administered 2022-06-10 – 2022-06-13 (×9): 300 mg via ORAL
  Filled 2022-06-10 (×9): qty 1

## 2022-06-10 MED ORDER — DEXAMETHASONE SODIUM PHOSPHATE 4 MG/ML IJ SOLN
4.0000 mg | Freq: Four times a day (QID) | INTRAMUSCULAR | Status: DC
Start: 2022-06-11 — End: 2022-06-11
  Administered 2022-06-10 – 2022-06-11 (×2): 4 mg via INTRAVENOUS
  Filled 2022-06-10 (×2): qty 1

## 2022-06-10 MED ORDER — FENTANYL CITRATE PF 50 MCG/ML IJ SOSY
PREFILLED_SYRINGE | INTRAMUSCULAR | Status: AC
  Filled 2022-06-10: qty 1

## 2022-06-10 MED ORDER — ENOXAPARIN SODIUM 40 MG/0.4ML IJ SOSY
40.0000 mg | PREFILLED_SYRINGE | INTRAMUSCULAR | Status: DC
  Administered 2022-06-10: 40 mg via SUBCUTANEOUS
  Filled 2022-06-10: qty 0.4

## 2022-06-10 MED ORDER — GABAPENTIN 300 MG PO CAPS
300.0000 mg | ORAL_CAPSULE | Freq: Once | ORAL | Status: AC
  Administered 2022-06-10: 300 mg via ORAL
  Filled 2022-06-10: qty 1

## 2022-06-10 MED ORDER — MORPHINE SULFATE (PF) 2 MG/ML IV SOLN
2.0000 mg | INTRAVENOUS | Status: DC | PRN
  Administered 2022-06-10 – 2022-06-11 (×3): 2 mg via INTRAVENOUS
  Filled 2022-06-10 (×3): qty 1

## 2022-06-10 MED ORDER — DEXAMETHASONE SODIUM PHOSPHATE 10 MG/ML IJ SOLN
10.0000 mg | Freq: Once | INTRAMUSCULAR | Status: AC
  Administered 2022-06-10: 10 mg via INTRAVENOUS
  Filled 2022-06-10: qty 1

## 2022-06-10 MED ORDER — ACETAMINOPHEN 325 MG PO TABS
650.0000 mg | ORAL_TABLET | Freq: Four times a day (QID) | ORAL | Status: DC | PRN
  Administered 2022-06-11: 650 mg via ORAL
  Filled 2022-06-10: qty 2

## 2022-06-10 MED ORDER — MAGNESIUM HYDROXIDE 400 MG/5ML PO SUSP
5.0000 mL | Freq: Every day | ORAL | Status: DC | PRN
  Administered 2022-06-11: 5 mL via ORAL
  Filled 2022-06-10: qty 30

## 2022-06-10 MED ORDER — KETOROLAC TROMETHAMINE 15 MG/ML IJ SOLN
15.0000 mg | Freq: Once | INTRAMUSCULAR | Status: AC
  Administered 2022-06-10: 15 mg via INTRAVENOUS
  Filled 2022-06-10: qty 1

## 2022-06-10 NOTE — ED Notes (Signed)
ED TO INPATIENT HANDOFF REPORT  ED Nurse Name and Phone #: Exie Parody, EMT-P  S Name/Age/Gender [name] 56 y.o. female Room/Bed: 020C/020C  Code Status   Code Status: Full Code  Home/SNF/Other Home Patient oriented to: self, place, time, and situation Is this baseline? Yes      Chief Complaint Lumbar radiculopathy [M54.16]  Triage Note Pt arrived by EMS. Opt had back surgery Friday, started having pain to L upper leg starting yesterday. 151mg fentanyl given by EMS and pt took '20mg'$  oxycodone 30 min before EMS arrived   Allergies Allergies  Allergen Reactions   Dilaudid [Hydromorphone Hcl] Anxiety   Phenytoin Sodium Extended Anxiety    Level of Care/Admitting Diagnosis ED Disposition     ED Disposition  Admit   Condition  --   [place][id number][place]  Level of Care: Progressive [102]  Admit to Progressive based on following criteria: NEUROLOGICAL AND NEUROSURGICAL complex patients with significant risk of instability, who do not meet ICU criteria, yet require close observation or frequent assessment (< / = every 2 - 4 hours) with medical / nursing intervention.  May admit patient to [facility] equivalent level of care is available:: No  Covid Evaluation: Asymptomatic - no recent exposure (last 10 days) testing not required  Diagnosis: Lumbar radiculopathy [[[id number]] Admitting Physician: DKarsten Ro[[[id number]] Attending Physician: PEarnie Larsson[[phone] Estimated length of stay: past midnight tomorrow  Certification:: I certify there are rare and unusual circumstances requiring inpatient admission  Bed request comments: 4NP or 3W          B Medical/Surgery History 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    Chronic back pain    Chronic, continuous use of opioids    Complication of anesthesia    wakes up during surgery   Coronary artery disease     non-obstr CAD-treat medically   Depression    Diverticulitis    Dyspnea    with exertion   GERD (gastroesophageal reflux disease)    High cholesterol    Hypertension    Pre-diabetes    Seizure ([place]    "on the [place]"- pt states she never knows she has them   Past Surgical History:  Procedure Laterality Date   ABDOMINAL HYSTERECTOMY     BRAIN TUMOR EXCISION     CARPAL TUNNEL RELEASE     CHOLECYSTECTOMY     ENDOMETRIAL ABLATION     IRRIGATION AND DEBRIDEMENT SHOULDER Left 06/29/2021   Procedure: IRRIGATION AND DEBRIDEMENT SHOULDER;  Surgeon: VHiram Gash MD;  [facility]  Service: Orthopedics;  Laterality: Left;  exparel block   LUMBAR LAMINECTOMY/DECOMPRESSION MICRODISCECTOMY Left 06/08/2022   Procedure: Left Lumbar Four-Five, Lumbar Five-Sacral One Laminectomy and Foraminotomywith Left  Lumbar Five-Sacral One Microdiscectomy;  Surgeon: PEarnie Larsson MD;  [facility]  Service: Neurosurgery;  Laterality: Left;   RESECTION DISTAL CLAVICAL Left 06/29/2021   Procedure: RESECTION DISTAL CLAVICLE;  Surgeon: VHiram Gash MD;  [place]  Service: Orthopedics;  Laterality: Left;  exparel block   SHOULDER ARTHROSCOPY WITH SUBACROMIAL DECOMPRESSION, ROTATOR CUFF REPAIR AND BICEP TENDON REPAIR Left 06/29/2021   Procedure: SHOULDER ARTHROSCOPY WITH SUBACROMIAL DECOMPRESSION, ROTATOR CUFF REPAIR AND BICEP TENDON REPAIR;  Surgeon: VHiram Gash MD;  [place] [facility]  Service: Orthopedics;  Laterality: Left;  exparel block  TUBAL LIGATION       A IV Location/Drains/Wounds Patient Lines/Drains/Airways Status     Active Line/Drains/Airways     Name Placement date Placement time Site Days   Peripheral IV 06/10/22 22 G Posterior;Right Hand 06/10/22  1742  Hand  less than 1   Incision (Closed) 06/29/21 Shoulder Left 06/29/21  0910  -- 346   Incision (Closed) 06/08/22 Back 06/08/22  0947  -- 2             Intake/Output Last 24 hours No intake or output data in the 24 hours ending 06/10/22 1918  Labs/Imaging Results for orders placed or performed during the hospital encounter of 06/10/22 (from the past 48 hour(s))  Basic metabolic panel     Status: Abnormal   Collection Time: 06/10/22 12:55 PM  Result Value Ref Range   Sodium 139 135 - 145 mmol/L   Potassium 4.3 3.5 - 5.1 mmol/L   Chloride 100 98 - 111 mmol/L   CO2 27 22 - 32 mmol/L   Glucose, Bld 102 (H) 70 - 99 mg/dL    Comment: Glucose reference range applies only to samples taken after fasting for at least 8 hours.   BUN 12 6 - 20 mg/dL   Creatinine, Ser 0.69 0.44 - 1.00 mg/dL   Calcium 8.6 (L) 8.9 - 10.3 mg/dL   GFR, Estimated >60 >60 mL/min    Comment: (NOTE) Calculated using the CKD-EPI Creatinine Equation (2021)    Anion gap 12 5 - 15    Comment: Performed at [facility] [address] [place], [place] [address]  CBC with Differential     Status: Abnormal   Collection Time: 06/10/22 12:55 PM  Result Value Ref Range   WBC 10.1 4.0 - 10.5 K/uL   RBC 3.62 (L) 3.87 - 5.11 MIL/uL   Hemoglobin 10.2 (L) 12.0 - 15.0 g/dL   HCT 30.9 (L) 36.0 - 46.0 %   MCV 85.4 80.0 - 100.0 fL   MCH 28.2 26.0 - 34.0 pg   MCHC 33.0 30.0 - 36.0 g/dL   RDW 13.6 11.5 - 15.5 %   Platelets 187 150 - 400 K/uL   nRBC 0.0 0.0 - 0.2 %   Neutrophils Relative % 64 %   Neutro Abs 6.6 1.7 - 7.7 K/uL   Lymphocytes Relative 24 %   Lymphs Abs 2.4 0.7 - 4.0 K/uL   Monocytes Relative 8 %   Monocytes Absolute 0.8 0.1 - 1.0 K/uL   Eosinophils Relative 2 %   Eosinophils Absolute 0.2 0.0 - 0.5 K/uL   Basophils Relative 1 %   Basophils Absolute 0.1 0.0 - 0.1 K/uL   Immature Granulocytes 1 %   Abs Immature Granulocytes 0.06 0.00 - 0.07 K/uL    Comment: Performed at [facility] [address], [place], [place] [address]  Creatinine, serum     Status: None   Collection Time: 06/10/22  5:30 PM  Result Value Ref Range   Creatinine, Ser 0.67  0.44 - 1.00 mg/dL   GFR, Estimated >60 >60 mL/min    Comment: (NOTE) Calculated using the CKD-EPI Creatinine Equation (2021) Performed at [facility] [address] [place], [place] [address]   CBC with Differential/Platelet     Status: Abnormal   Collection Time: 06/10/22  5:30 PM  Result Value Ref Range   WBC 8.7 4.0 - 10.5 K/uL   RBC 3.66 (L) 3.87 - 5.11 MIL/uL   Hemoglobin 9.9 (L) 12.0 -  15.0 g/dL   HCT 31.3 (L) 36.0 - 46.0 %   MCV 85.5 80.0 - 100.0 fL   MCH 27.0 26.0 - 34.0 pg   MCHC 31.6 30.0 - 36.0 g/dL   RDW 13.7 11.5 - 15.5 %   Platelets 189 150 - 400 K/uL   nRBC 0.0 0.0 - 0.2 %   Neutrophils Relative % 72 %   Neutro Abs 6.3 1.7 - 7.7 K/uL   Lymphocytes Relative 21 %   Lymphs Abs 1.8 0.7 - 4.0 K/uL   Monocytes Relative 4 %   Monocytes Absolute 0.4 0.1 - 1.0 K/uL   Eosinophils Relative 1 %   Eosinophils Absolute 0.1 0.0 - 0.5 K/uL   Basophils Relative 1 %   Basophils Absolute 0.1 0.0 - 0.1 K/uL   Immature Granulocytes 1 %   Abs Immature Granulocytes 0.06 0.00 - 0.07 K/uL    Comment: Performed at [facility] [address], [place], [place] [address]   No results found.  Pending Labs Unresulted Labs (From admission, onward)     Start     Ordered   06/17/22 0500  Creatinine, serum  (enoxaparin (LOVENOX)    CrCl >/= 30 ml/min)  Weekly,   R     Comments: while on enoxaparin therapy    06/10/22 1734            Vitals/Pain Today's Vitals   06/10/22 1609 06/10/22 1630 06/10/22 1700 06/10/22 1800  BP: 118/72 124/85 128/81 115/66  Pulse: 81 76 78 90  Resp: '14 14 14 17  '$ Temp:      TempSrc:      SpO2: 100% 99% 98% 94%  Weight:      Height:      PainSc:        Isolation Precautions No active isolations  Medications Medications  fentaNYL (SUBLIMAZE) injection 100 mcg ( Intravenous Not Given 06/10/22 1428)  gabapentin (NEURONTIN) capsule 300 mg (has no administration in time range)  methocarbamol (ROBAXIN) 1,000 mg in dextrose 5 % 100  mL IVPB (has no administration in time range)  ketorolac (TORADOL) 15 MG/ML injection 15 mg (15 mg Intravenous Given 06/10/22 1720)  morphine (PF) 2 MG/ML injection 2 mg (has no administration in time range)  oxyCODONE (Oxy IR/ROXICODONE) immediate release tablet 20 mg (0 mg Oral Hold 06/10/22 1807)  enoxaparin (LOVENOX) injection 40 mg (has no administration in time range)  sodium chloride flush (NS) 0.9 % injection 3 mL (has no administration in time range)  0.9 %  sodium chloride infusion ( Intravenous New Bag/Given 06/10/22 1832)  acetaminophen (TYLENOL) tablet 650 mg (has no administration in time range)    Or  acetaminophen (TYLENOL) suppository 650 mg (has no administration in time range)  docusate sodium (COLACE) capsule 100 mg (has no administration in time range)  senna (SENOKOT) tablet 8.6 mg (has no administration in time range)  polyethylene glycol (MIRALAX / GLYCOLAX) packet 17 g (has no administration in time range)  bisacodyl (DULCOLAX) suppository 10 mg (has no administration in time range)  sodium phosphate (FLEET) 7-19 GM/118ML enema 1 enema (has no administration in time range)  ondansetron (ZOFRAN) tablet 4 mg (has no administration in time range)    Or  ondansetron (ZOFRAN) injection 4 mg (has no administration in time range)  magnesium hydroxide (MILK OF MAGNESIA) suspension 5 mL (has no administration in time range)  dexamethasone (DECADRON) injection 4 mg (has no administration in time range)  ketorolac (TORADOL) 15 MG/ML injection 15  mg (15 mg Intravenous Given 06/10/22 1407)  fentaNYL (SUBLIMAZE) injection 100 mcg (100 mcg Intravenous Given 06/10/22 1406)  dexamethasone (DECADRON) injection 10 mg (10 mg Intravenous Given 06/10/22 1606)  gabapentin (NEURONTIN) capsule 300 mg (300 mg Oral Given 06/10/22 1609)    Mobility non-ambulatory     Focused Assessments    R Recommendations: See Admitting Provider Note  Report given to:   Additional Notes:

## 2022-06-10 NOTE — ED Notes (Addendum)
Spoke to [facility] about giving report on patient and was told that due to an issue in staffing they were not sure they would be able to take this patient. They are speaking with the [facility] and would get back to me with more information.

## 2022-06-10 NOTE — ED Triage Notes (Signed)
Pt arrived by EMS. Opt had back surgery Friday, started having pain to L upper leg starting yesterday. 137mg fentanyl given by EMS and pt took '20mg'$  oxycodone 30 min before EMS arrived

## 2022-06-10 NOTE — Discharge Instructions (Addendum)
You are being prescribed a steroid Dosepak called Medrol as well as gabapentin which helps with nerve type pain.  For the gabapentin, start 300 mg (1 capsule) each day for 2 days, then 1 capsule twice per day for 2 days then 1 capsule 3 times a day and discussed with your neurosurgeon  If you develop worsening, recurrent, or continued back pain, numbness or weakness in the legs, incontinence of your bowels or bladders, numbness of your buttocks, fever, abdominal pain, or any other new/concerning symptoms then return to the ER for evaluation.

## 2022-06-10 NOTE — ED Provider Notes (Signed)
[facility] EMERGENCY DEPARTMENT Provider Note   CSN: [id number] Arrival date & time: 06/10/22  1225     History  Chief Complaint  Patient presents with   Back Pain    [name] is a 56 y.o. female.  HPI 56 year old female presents with intractable left posterior thigh pain.  She had lumbar laminectomy 2 days ago.  She has been dealing with back pain and left leg symptoms but now her symptoms are severe.  She states that she went home yesterday and then around 3 PM she noticed the severe symptoms.  Primarily having pain in her posterior thigh.  There is also pain in her back near the surgical site.  No weakness or numbness associated with this.  No new incontinence.  She has been on oxycodone with no relief and was given fentanyl by EMS.  Home Medications Prior to Admission medications   Medication Sig Start Date End Date Taking? Authorizing Provider  gabapentin (NEURONTIN) 300 MG capsule Start with 300 mg daily for 2 days then go to 300 mg twice per day for 2 days and then go to 300 mg 3 times per day and then discussed with your neurosurgeon 06/10/22  Yes Sherwood Gambler, MD  methylPREDNISolone (MEDROL DOSEPAK) 4 MG TBPK tablet Take per package instructions 06/10/22  Yes Sherwood Gambler, MD  aspirin EC 81 MG tablet Take 81 mg by mouth daily. Swallow whole.    [provider]  atorvastatin (LIPITOR) 80 MG tablet Take 80 mg by mouth daily.    [provider]  carvedilol (COREG) 6.25 MG tablet Take 6.25 mg by mouth 2 (two) times daily with a meal.    [provider]  Cholecalciferol (VITAMIN D3) 1.25 MG (50000 UT) CAPS Take 50,000 Units by mouth once a week.    [provider]  esomeprazole (NEXIUM) 40 MG capsule Take 40 mg by mouth 2 (two) times daily before a meal.    [provider]  estradiol (ESTRACE) 1 MG tablet Take 1 mg by mouth daily.    [provider]  hydrOXYzine (ATARAX) 25 MG tablet Take 25 mg by  mouth daily. 04/02/22   [provider]  icosapent Ethyl (VASCEPA) 1 g capsule Take 1 g by mouth 2 (two) times daily.    [provider]  lamoTRIgine (LAMICTAL) 25 MG tablet Take 25 mg by mouth 2 (two) times daily. 05/16/22   [provider]  lisinopril-hydrochlorothiazide (ZESTORETIC) 10-12.5 MG tablet Take 1 tablet by mouth daily.    [provider]  methocarbamol (ROBAXIN-750) 750 MG tablet Take 1 tablet (750 mg total) by mouth every 6 (six) hours as needed for muscle spasms. 06/09/22   Marvis Moeller, NP  montelukast (SINGULAIR) 10 MG tablet Take 10 mg by mouth at bedtime.    [provider]  naloxone [facility]) nasal spray 4 mg/0.1 mL 4 mg. 12/19/20   [provider]  Oxycodone HCl 20 MG TABS Take 1 tablet by mouth every 4 (four) hours as needed (pain from brain surgery).    [provider]  RESTASIS 0.05 % ophthalmic emulsion Place 1 drop into both eyes daily. 04/10/22   [provider]  sertraline (ZOLOFT) 100 MG tablet Take 100 mg by mouth 2 (two) times daily.    [provider]  vitamin B-12 (CYANOCOBALAMIN) 1000 MCG tablet Take 1,000 mcg by mouth daily.    [provider]      Allergies    Dilaudid [hydromorphone hcl]  and Phenytoin sodium extended    Review of Systems   Review of Systems  Gastrointestinal:  Negative for abdominal pain.  Musculoskeletal:  Positive for back pain and myalgias.  Neurological:  Negative for weakness.    Physical Exam Updated Vital Signs BP 118/75   Pulse 81   Temp 98.2 F (36.8 C) (Oral)   Resp 17   Ht '5\' 5"'$  (1.651 m)   Wt 60 kg   SpO2 93%   BMI 22.01 kg/m  Physical Exam Vitals and nursing note reviewed.  Constitutional:      Appearance: She is well-developed. She is obese.  HENT:     Head: Normocephalic and atraumatic.  Cardiovascular:     Rate and Rhythm: Normal rate and regular rhythm.     Pulses:          Dorsalis pedis pulses are 2+ on the left  side.  Pulmonary:     Effort: Pulmonary effort is normal.  Abdominal:     Palpations: Abdomen is soft.     Tenderness: There is no abdominal tenderness.  Musculoskeletal:     Comments: No tenderness or obvious swelling to the left thigh.  Surgical site is covered with postop adhesive  Skin:    General: Skin is warm and dry.  Neurological:     Mental Status: She is alert.     Deep Tendon Reflexes:     Reflex Scores:      Patellar reflexes are 2+ on the right side and 2+ on the left side.      Achilles reflexes are 2+ on the right side and 2+ on the left side.    Comments: 5/5 strength in right lower extremity.  Perhaps mild weakness in the left lower extremity but seems more pain related and is limited due to her pain.  Grossly normal sensation bilaterally.     ED Results / Procedures / Treatments   Labs (all labs ordered are listed, but only abnormal results are displayed) Labs Reviewed  BASIC METABOLIC PANEL - Abnormal; Notable for the following components:      Result Value   Glucose, Bld 102 (*)    Calcium 8.6 (*)    All other components within normal limits  CBC WITH DIFFERENTIAL/PLATELET - Abnormal; Notable for the following components:   RBC 3.62 (*)    Hemoglobin 10.2 (*)    HCT 30.9 (*)    All other components within normal limits    EKG None  Radiology No results found.  Procedures Procedures    Medications Ordered in ED Medications  fentaNYL (SUBLIMAZE) 50 MCG/ML injection (  Not Given 06/10/22 1428)  ketorolac (TORADOL) 15 MG/ML injection 15 mg (15 mg Intravenous Given 06/10/22 1407)  fentaNYL (SUBLIMAZE) injection 100 mcg (100 mcg Intravenous Given 06/10/22 1406)    ED Course/ Medical Decision Making/ A&P Clinical Course as of 06/10/22 1438  Sun Jun 10, 2022  1321 I discussed with neurosurgery on-call, Weston Brass.  He feels this is likely postoperative pain and recommends pain control in the ER and if better then discharging with 300 mg gabapentin,  starting daily and escalating to 3 times daily over 4 days or so.  Would also recommend Medrol Dosepak and making sure she is on a muscle relaxer.  Feels it's unlikely she needs an emergent MRI. [SG]    Clinical Course User Index [SG] Sherwood Gambler, MD  Medical Decision Making Amount and/or Complexity of Data Reviewed Labs: ordered.    Details: Normal WBC.  Electrolytes overall unremarkable including glucose of 102  Risk Prescription drug management.   Patient was given IV fentanyl and IV Toradol and feels a lot better.  Now is freely moving her left lower extremity.  Low suspicion she has a DVT as there is no reproducible pain or swelling.  I think this is nerve type pain and similar but more severe than when she had her surgery.  However there is no focal neurodeficits or bowel/bladder symptoms and so I highly doubt acute spinal cord emergency.  I discussed with neurosurgery as above and we will add gabapentin and Medrol Dosepak to her oxycodone and muscle relaxer.  She will be discharged to follow-up with Dr. Annette Stable.  Given return precautions  3:12 PM Nurse was attempting to discharge patient but now her pain is intractable again and she cannot stand up.  I will consult neurosurgery again.  3:55 PM Discussed with Neurosurgery, they will see and admit. Advises IV decadron. Will give oral gabapentin.  Final Clinical Impression(s) / ED Diagnoses Final diagnoses:  Post-operative pain    Rx / DC Orders ED Discharge Orders          Ordered    gabapentin (NEURONTIN) 300 MG capsule        06/10/22 1435    methylPREDNISolone (MEDROL DOSEPAK) 4 MG TBPK tablet        06/10/22 1435              Sherwood Gambler, MD 06/10/22 1556

## 2022-06-10 NOTE — H&P (Signed)
Chief Complaint: LLE pain  HPI: [name] is a 56 y.o. female who is s/p L4-5 decompressive laminotomy and foraminotomies, with left L5-S1 decompressive laminotomy/foraminotomies and left L5 microdiscectomy by Dr. Annette Stable on 06/08/2022. The patient was initially recovering well from her surgery and was discharge to home on 06/09/2022 in stable condition. During the day yesterday, she reports progressively worsening LLE pain that affected her left anterolateral thigh. She was unable to describe the character of pain but did state it felt like a "yanking" sensation in her thigh. She was initially able to ambulate with a walker, however, as her pain progressed, she was unable to stand with or without assistance. She denies buttock pain or pain radiating below her knee. She further denies paresthesia. She denied bowl dysfunction. She has been having some urinary retention as she is unable to urinate while sitting. She is able to urinate while laying down. She denied weakness but stated she was unable to stand due to the exacerbation of the LLE pain that is elicited with bearing weight. She has She reports appropriate incisional pain without any reports of wound drainage or complications. She is on baseline chronic opioid therapy through pain management. It appears that her pain regimen was not changed for her postoperative period.   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[place])    Chronic back pain    Chronic, continuous use of opioids    Complication of anesthesia    wakes up during surgery   Coronary artery disease    non-obstr CAD-treat medically   Depression    Diverticulitis    Dyspnea    with exertion   GERD (gastroesophageal reflux disease)    High cholesterol    Hypertension    Pre-diabetes    Seizure ([place])    "on the brain"- pt states she never knows she has them    Past Surgical History:  Procedure Laterality Date   ABDOMINAL HYSTERECTOMY     BRAIN  TUMOR EXCISION     CARPAL TUNNEL RELEASE     CHOLECYSTECTOMY     ENDOMETRIAL ABLATION     IRRIGATION AND DEBRIDEMENT SHOULDER Left 06/29/2021   Procedure: IRRIGATION AND DEBRIDEMENT SHOULDER;  Surgeon: Hiram Gash, MD;  Location: [facility];  Service: Orthopedics;  Laterality: Left;  exparel block   LUMBAR LAMINECTOMY/DECOMPRESSION MICRODISCECTOMY Left 06/08/2022   Procedure: Left Lumbar Four-Five, Lumbar Five-Sacral One Laminectomy and Foraminotomywith Left  Lumbar Five-Sacral One Microdiscectomy;  Surgeon: Earnie Larsson, MD;  Location: [facility];  Service: Neurosurgery;  Laterality: Left;   RESECTION DISTAL CLAVICAL Left 06/29/2021   Procedure: RESECTION DISTAL CLAVICLE;  Surgeon: Hiram Gash, MD;  Location: [facility];  Service: Orthopedics;  Laterality: Left;  exparel block   SHOULDER ARTHROSCOPY WITH SUBACROMIAL DECOMPRESSION, ROTATOR CUFF REPAIR AND BICEP TENDON REPAIR Left 06/29/2021   Procedure: SHOULDER ARTHROSCOPY WITH SUBACROMIAL DECOMPRESSION, ROTATOR CUFF REPAIR AND BICEP TENDON REPAIR;  Surgeon: Hiram Gash, MD;  Location: [facility];  Service: Orthopedics;  Laterality: Left;  exparel block   TUBAL LIGATION      Family History  Problem Relation Age of Onset   Hypertension Mother    Hyperlipidemia Mother    Hypertension Father    Hyperlipidemia Father    Heart failure Father    Social History:  reports that she has never smoked. She has never used smokeless tobacco. She reports that she does not drink alcohol  and does not use drugs.  Allergies:  Allergies  Allergen Reactions   Dilaudid [Hydromorphone Hcl] Anxiety   Phenytoin Sodium Extended Anxiety    (Not in a hospital admission)   Results for orders placed or performed during the hospital encounter of 06/10/22 (from the past 48 hour(s))  Basic metabolic panel     Status: Abnormal   Collection Time: 06/10/22 12:55 PM  Result Value Ref Range   Sodium 139 135 - 145 mmol/L    Potassium 4.3 3.5 - 5.1 mmol/L   Chloride 100 98 - 111 mmol/L   CO2 27 22 - 32 mmol/L   Glucose, Bld 102 (H) 70 - 99 mg/dL    Comment: Glucose reference range applies only to samples taken after fasting for at least 8 hours.   BUN 12 6 - 20 mg/dL   Creatinine, Ser 0.69 0.44 - 1.00 mg/dL   Calcium 8.6 (L) 8.9 - 10.3 mg/dL   GFR, Estimated >60 >60 mL/min    Comment: (NOTE) Calculated using the CKD-EPI Creatinine Equation (2021)    Anion gap 12 5 - 15    Comment: Performed at [facility] [address], [place], [place] [address]  CBC with Differential     Status: Abnormal   Collection Time: 06/10/22 12:55 PM  Result Value Ref Range   WBC 10.1 4.0 - 10.5 K/uL   RBC 3.62 (L) 3.87 - 5.11 MIL/uL   Hemoglobin 10.2 (L) 12.0 - 15.0 g/dL   HCT 30.9 (L) 36.0 - 46.0 %   MCV 85.4 80.0 - 100.0 fL   MCH 28.2 26.0 - 34.0 pg   MCHC 33.0 30.0 - 36.0 g/dL   RDW 13.6 11.5 - 15.5 %   Platelets 187 150 - 400 K/uL   nRBC 0.0 0.0 - 0.2 %   Neutrophils Relative % 64 %   Neutro Abs 6.6 1.7 - 7.7 K/uL   Lymphocytes Relative 24 %   Lymphs Abs 2.4 0.7 - 4.0 K/uL   Monocytes Relative 8 %   Monocytes Absolute 0.8 0.1 - 1.0 K/uL   Eosinophils Relative 2 %   Eosinophils Absolute 0.2 0.0 - 0.5 K/uL   Basophils Relative 1 %   Basophils Absolute 0.1 0.0 - 0.1 K/uL   Immature Granulocytes 1 %   Abs Immature Granulocytes 0.06 0.00 - 0.07 K/uL    Comment: Performed at [facility] [address], [place], [place] [address]   No results found.  ROS: Per HPI  Blood pressure 118/72, pulse 81, temperature 98.2 F (36.8 C), temperature source Oral, resp. rate 14, height '5\' 5"'$  (1.651 m), weight 60 kg, SpO2 100 %. Physical Exam: Patient is awake, A/O X 4, conversant, and in good spirits. Eyes open spontaneously. She is in moderate distress due to LLE pain. Speech is fluent and appropriate. MAEW. LLE strength testing is limited secondary to severe pain. Sensation to light touch is intact. PERLA, EOMI.  CNs grossly intact. Dressing is clean dry intact. Incision is well approximated with no drainage, erythema, or edema.    Assessment/Plan 56 y.o. female who is s/p L4-5 decompressive laminotomy and foraminotomies, with left L5-S1 decompressive laminotomy/foraminotomies and left L5 microdiscectomy by Dr. Annette Stable on 06/08/2022. Since her discharge yesterday, she has been having progressively worsening left posterolateral thigh pain that has been refractory to her typical pain regimen. While in the ED, she was given IV steroids, fentanyl, Toradol, and started on gabapentin. She was initially able to obtain some relief; however,  this was short lived. She is on a significant amount of opioids at baseline. Her examination was limited due to the severe LLE pain but did not feel that she had weakness. Her incision is appropriate. I do not feel that this is a postoperative hematoma and will forego imaging at this time as this would likely show a fluid collection which is to be expected. I believe this is poor pain control in the setting of chronic opioid therapy. She was admitted for pain control.     Marvis Moeller, DNP, AGNP-C Neurosurgery Nurse Practitioner  [facility] [address], [place], [place] [address] P: [phone]    F: [phone]  06/10/2022 5:09 PM

## 2022-06-11 MED ORDER — HYDROMORPHONE HCL 1 MG/ML IJ SOLN
1.0000 mg | INTRAMUSCULAR | Status: DC | PRN
  Administered 2022-06-11 – 2022-06-12 (×3): 1 mg via INTRAVENOUS
  Filled 2022-06-11 (×3): qty 1

## 2022-06-11 MED ORDER — OXYCODONE HCL 5 MG PO TABS
10.0000 mg | ORAL_TABLET | Freq: Three times a day (TID) | ORAL | Status: DC | PRN
  Administered 2022-06-11: 10 mg via ORAL
  Filled 2022-06-11: qty 2

## 2022-06-11 MED ORDER — DEXAMETHASONE SODIUM PHOSPHATE 10 MG/ML IJ SOLN
8.0000 mg | Freq: Four times a day (QID) | INTRAMUSCULAR | Status: DC
Start: 2022-06-11 — End: 2022-06-13
  Administered 2022-06-11 – 2022-06-13 (×9): 8 mg via INTRAVENOUS
  Filled 2022-06-11 (×9): qty 1

## 2022-06-11 MED ORDER — PHENOL 1.4 % MT LIQD
1.0000 | OROMUCOSAL | Status: DC | PRN
  Filled 2022-06-11: qty 177

## 2022-06-11 NOTE — Evaluation (Signed)
Physical Therapy Evaluation Patient Details Name: [name] MRN: [id number] DOB: 09/29/1966 Today's Date: 06/11/2022  History of Present Illness  56 year old female, status post L4-5, L5-S1 laminotomy/foraminotomies and L L5 microdiscectomy on 06/08/22, with now worsening left thigh pain.  PMH positive for CAD, depression, diverticulitis, GERD, HTN, DM, and seicures.  Clinical Impression  Patient not tolerating mobility well, but pushed through to find some mobility with slightly less pain keeping L LE flexed and with limited weight bearing.  Patient eager to get home, but unable to tolerate mobility for home unless possibly can get wheelchair till able to walk.  Educated on nerve glide technique and hip rotator stretching to attempt to find some relief.   She may benefit from referral to outpatient PT at d/c.  Will follow up to see if able to negotiate steps for home entry.        Recommendations for follow up therapy are one component of a multi-disciplinary discharge planning process, led by the attending physician.  Recommendations may be updated based on patient status, additional functional criteria and insurance authorization.  Follow Up Recommendations Outpatient PT      Assistance Recommended at Discharge Frequent or constant Supervision/Assistance  Patient can return home with the following  A lot of help with walking and/or transfers;Assistance with cooking/housework;Assist for transportation;A little help with bathing/dressing/bathroom;Help with stairs or ramp for entrance    Equipment Recommendations Wheelchair cushion (measurements PT);Wheelchair (measurements PT)  Recommendations for Other Services       Functional Status Assessment Patient has had a recent decline in their functional status and demonstrates the ability to make significant improvements in function in a reasonable and predictable amount of time.     Precautions / Restrictions Precautions Precautions:  Fall;Back      Mobility  Bed Mobility Overal bed mobility: Needs Assistance Bed Mobility: Rolling, Sidelying to Sit, Sit to Sidelying Rolling: Supervision Sidelying to sit: Min assist     Sit to sidelying: Min assist General bed mobility comments: painful so assist to lift trunk and pt using heavy UE support, to side with A for L LE    Transfers Overall transfer level: Needs assistance Equipment used: Rolling walker (2 wheels) Transfers: Sit to/from Stand Sit to Stand: Min guard           General transfer comment: effortful and painful to rise, assist for balance    Ambulation/Gait Ambulation/Gait assistance: Min assist Gait Distance (Feet): 4 Feet (forward and backward) Assistive device: Rolling walker (2 wheels) Gait Pattern/deviations: Step-to pattern, Decreased weight shift to left       General Gait Details: pain with L LE extension so cues to keep L LE in front and pt basically TTWB on L and tearful throughout  Stairs            Wheelchair Mobility    Modified Rankin (Stroke Patients Only)       Balance Overall balance assessment: Needs assistance   Sitting balance-Leahy Scale: Good     Standing balance support: Bilateral upper extremity supported, Reliant on assistive device for balance Standing balance-Leahy Scale: Poor Standing balance comment: due to L LE pain                             Pertinent Vitals/Pain Pain Assessment Pain Assessment: 0-10 Pain Score: 5  Pain Location: L posterior thigh when laying in bed post-Dilaudid Pain Descriptors / Indicators: Constant, Discomfort Pain Intervention(s): Monitored during session, Premedicated  before session    [place] expects to be discharged to:: Private residence Living Arrangements: Spouse/significant other;Children;Other relatives Available Help at Discharge: Family;Available 24 hours/day Type of Home: House Home Access: Stairs to enter Entrance  Stairs-Rails: Can reach both;Left;Right Entrance Stairs-Number of Steps: 4   Home Layout: One level Home Equipment: Conservation officer, nature (2 wheels)      Prior Function Prior Level of Function : Needs assist             Mobility Comments: able to get into house after d/c from surgery, but later in the afternoon Saturday had more pain L posterior thigh and could not walk or get comfortable, so spouse called ambulance       Hand Dominance   Dominant Hand: Right    Extremity/Trunk Assessment   Upper Extremity Assessment Upper Extremity Assessment: Overall WFL for tasks assessed    Lower Extremity Assessment Lower Extremity Assessment: LLE deficits/detail LLE Deficits / Details: lifts but painful on posterior proximal thigh with movement LLE Sensation: decreased light touch (experiences numbness in toes after mobility, but transient)    Cervical / Trunk Assessment Cervical / Trunk Assessment: Back Surgery  Communication   Communication: No difficulties  Cognition Arousal/Alertness: Awake/alert Behavior During Therapy: WFL for tasks assessed/performed Overall Cognitive Status: Within Functional Limits for tasks assessed                                          General Comments      Exercises Other Exercises Other Exercises: in supine pt performedL piriformis stretch with cues (painful but cues to tolerate to see if improved pain) Other Exercises: in sitting assisted for L hip ext rotator stretch x 20 sec Other Exercises: supine on R performed nerve gliding technique (AP, HS, knee extension and AP with hip flexion) x 5 each and encouraged to try on L side when able to tolerate   Assessment/Plan    PT Assessment Patient needs continued PT services  PT Problem List Decreased activity tolerance;Pain;Impaired sensation;Decreased mobility       PT Treatment Interventions DME instruction;Therapeutic activities;Therapeutic exercise;Patient/family  education;Gait training;Functional mobility training;Stair training    PT Goals (Current goals can be found in the Care Plan section)  Acute Rehab PT Goals Patient Stated Goal: to get home soon with less pain PT Goal Formulation: With patient Time For Goal Achievement: 06/18/22 Potential to Achieve Goals: Good    Frequency Min 3X/week     Co-evaluation               AM-PAC PT "6 Clicks" Mobility  Outcome Measure Help needed turning from your back to your side while in a flat bed without using bedrails?: A Little Help needed moving from lying on your back to sitting on the side of a flat bed without using bedrails?: A Little Help needed moving to and from a bed to a chair (including a wheelchair)?: A Little Help needed standing up from a chair using your arms (e.g., wheelchair or bedside chair)?: A Little Help needed to walk in hospital room?: Total Help needed climbing 3-5 steps with a railing? : Total 6 Click Score: 14    End of Session   Activity Tolerance: Patient limited by pain Patient left: in bed;with call bell/phone within reach   PT Visit Diagnosis: Difficulty in walking, not elsewhere classified (R26.2);Pain Pain - Right/Left: Left Pain - part  of body: Hip    Time: 5883-2549 PT Time Calculation (min) (ACUTE ONLY): 26 min   Charges:   PT Evaluation $PT Eval Moderate Complexity: 1 Mod PT Treatments $Therapeutic Activity: 8-22 mins        Magda Kiel, PT Acute Rehabilitation Services IYMEB:[phone] Office:[phone] 06/11/2022   Reginia Naas 06/11/2022, 6:32 PM

## 2022-06-11 NOTE — Progress Notes (Signed)
   Providing Compassionate, Quality Care - Together  NEUROSURGERY PROGRESS NOTE   S: No issues overnight. C/o L thigh pain/buttock pain, denies groin numbness/bowel or bladder difficulty  O: EXAM:  BP 131/71 (BP Location: Left Arm)   Pulse 80   Temp 97.7 F (36.5 C) (Oral)   Resp 15   Ht '5\' 5"'$  (1.651 m)   Wt 60 kg   SpO2 97%   BMI 22.01 kg/m   Awake, alert, oriented  PERRL Speech fluent, appropriate  CNs grossly intact  5/5 BUE 5/5 RLE 4-/5 HF/hip abduction, otherwise 5/5 in  LLE  Dressing c/d/di  ASSESSMENT:  56 y.o. female with   Lumbar spondylosis with LLE radiculopathy  S/p L4-S1 foraminotomies 06/08/22  PLAN: - pain control, patient has a high home regimen and likely needs tapering up, now on oxycodone '20mg'$  q4 hr -will increase steroids -pt/ot eval     Thank you for allowing me to participate in this patient's care.  Please do not hesitate to call with questions or concerns.   Elwin Sleight, [facility] Cell: [phone]

## 2022-06-12 MED ORDER — PANTOPRAZOLE SODIUM 40 MG PO TBEC
40.0000 mg | DELAYED_RELEASE_TABLET | Freq: Every day | ORAL | Status: DC
  Administered 2022-06-12: 40 mg via ORAL
  Filled 2022-06-12: qty 1

## 2022-06-12 MED ORDER — SERTRALINE HCL 100 MG PO TABS
100.0000 mg | ORAL_TABLET | Freq: Every day | ORAL | Status: DC
  Administered 2022-06-12: 100 mg via ORAL
  Filled 2022-06-12: qty 1

## 2022-06-12 MED ORDER — ESTRADIOL 1 MG PO TABS
1.0000 mg | ORAL_TABLET | Freq: Every day | ORAL | Status: DC
  Administered 2022-06-12: 1 mg via ORAL
  Filled 2022-06-12 (×2): qty 1

## 2022-06-12 MED ORDER — LAMOTRIGINE 25 MG PO TABS
25.0000 mg | ORAL_TABLET | Freq: Two times a day (BID) | ORAL | Status: DC
  Administered 2022-06-12 – 2022-06-13 (×2): 25 mg via ORAL
  Filled 2022-06-12 (×3): qty 1

## 2022-06-12 MED ORDER — MONTELUKAST SODIUM 10 MG PO TABS
10.0000 mg | ORAL_TABLET | Freq: Every day | ORAL | Status: DC
  Administered 2022-06-12: 10 mg via ORAL
  Filled 2022-06-12: qty 1

## 2022-06-12 MED ORDER — HYDROXYZINE HCL 25 MG PO TABS
25.0000 mg | ORAL_TABLET | Freq: Every day | ORAL | Status: DC
  Administered 2022-06-12: 25 mg via ORAL
  Filled 2022-06-12: qty 1

## 2022-06-12 MED ORDER — ICOSAPENT ETHYL 1 G PO CAPS
2.0000 g | ORAL_CAPSULE | Freq: Two times a day (BID) | ORAL | Status: DC
Start: 2022-06-12 — End: 2022-06-13
  Administered 2022-06-12 – 2022-06-13 (×2): 2 g via ORAL
  Filled 2022-06-12 (×3): qty 2

## 2022-06-12 MED ORDER — ATORVASTATIN CALCIUM 80 MG PO TABS
80.0000 mg | ORAL_TABLET | Freq: Every day | ORAL | Status: DC
  Administered 2022-06-12: 80 mg via ORAL
  Filled 2022-06-12: qty 1

## 2022-06-12 MED ORDER — LISINOPRIL 10 MG PO TABS
10.0000 mg | ORAL_TABLET | Freq: Every day | ORAL | Status: DC
  Administered 2022-06-12: 10 mg via ORAL
  Filled 2022-06-12: qty 1

## 2022-06-12 MED ORDER — CARVEDILOL 6.25 MG PO TABS
6.2500 mg | ORAL_TABLET | Freq: Every day | ORAL | Status: DC
  Administered 2022-06-12: 6.25 mg via ORAL
  Filled 2022-06-12: qty 1

## 2022-06-12 MED ORDER — VITAMIN B-12 1000 MCG PO TABS
1000.0000 ug | ORAL_TABLET | Freq: Every day | ORAL | Status: DC
  Administered 2022-06-12: 1000 ug via ORAL
  Filled 2022-06-12: qty 1

## 2022-06-12 MED ORDER — HYDROCHLOROTHIAZIDE 12.5 MG PO TABS
12.5000 mg | ORAL_TABLET | Freq: Every day | ORAL | Status: DC
  Administered 2022-06-12: 12.5 mg via ORAL
  Filled 2022-06-12: qty 1

## 2022-06-12 MED ORDER — CYCLOSPORINE 0.05 % OP EMUL
1.0000 [drp] | Freq: Two times a day (BID) | OPHTHALMIC | Status: DC
  Administered 2022-06-13: 1 [drp] via OPHTHALMIC
  Filled 2022-06-12 (×3): qty 30

## 2022-06-12 NOTE — Evaluation (Signed)
Occupational Therapy Evaluation Patient Details Name: [name] MRN: [id number] DOB: Dec 20, 1965 Today's Date: 06/12/2022   History of Present Illness 56 year old female, status post L4-5, L5-S1 laminotomy/foraminotomies and L L5 microdiscectomy on 06/08/22, with now worsening left thigh pain.  PMH positive for CAD, depression, diverticulitis, GERD, HTN, DM, and seicures.   Clinical Impression   [name] was evaluated s/p the above admission list, she is generally indep at baseline but was needing some assist since her recent back surgery. She was only home 1 day prior to the LLE pain inhibiting her ambulation. Initially pt's pain was better controled this date, she was able to get OOB, transfer to standing and ambulate ~45f with min G and RW. However, once back to the room pt was in 8/10 pain and required extensive supine rest break. She is significantly limited by the LLE pain, and thus requires up to min [name] for LB ADLs. She will benefit from OT acutely. Recommend d/c home with [place]      Recommendations for follow up therapy are one component of [name] multi-disciplinary discharge planning process, led by the attending physician.  Recommendations may be updated based on patient status, additional functional criteria and insurance authorization.   Follow Up Recommendations  Home health OT    Assistance Recommended at Discharge Frequent or constant Supervision/Assistance  Patient can return home with the following [name] little help with walking and/or transfers;[name] lot of help with bathing/dressing/bathroom;Direct supervision/assist for medications management;Assist for transportation;Help with stairs or ramp for entrance    Functional Status Assessment  Patient has had [name] recent decline in their functional status and demonstrates the ability to make significant improvements in function in [name] reasonable and predictable amount of time.  Equipment Recommendations  Other (comment) (Possible need for WC  pending pain mangement)    Recommendations for Other Services       Precautions / Restrictions Precautions Precautions: Fall;Back Precaution Booklet Issued: Yes (comment) Precaution Comments: verbalized understanding Restrictions Weight Bearing Restrictions: No      Mobility Bed Mobility Overal bed mobility: Needs Assistance Bed Mobility: Rolling, Sidelying to Sit, Sit to Sidelying Rolling: Supervision Sidelying to sit: Supervision     Sit to sidelying: Supervision General bed mobility comments: cues for technique but no physical assist    Transfers Overall transfer level: Needs assistance Equipment used: Rolling walker (2 wheels) Transfers: Sit to/from Stand Sit to Stand: Min guard                  Balance Overall balance assessment: Needs assistance Sitting-balance support: Feet supported Sitting balance-Leahy Scale: Good     Standing balance support: Bilateral upper extremity supported, Reliant on assistive device for balance Standing balance-Leahy Scale: Poor                             ADL either performed or assessed with clinical judgement   ADL Overall ADL's : Needs assistance/impaired Eating/Feeding: Independent;Sitting   Grooming: Set up;Sitting   Upper Body Bathing: Set up;Sitting   Lower Body Bathing: Minimal assistance;Sit to/from stand   Upper Body Dressing : Set up;Sitting   Lower Body Dressing: Minimal assistance;Sit to/from stand   Toilet Transfer: Min guard;Ambulation;Rolling walker (2 wheels)   Toileting- Clothing Manipulation and Hygiene: Min guard;Sitting/lateral lean       Functional mobility during ADLs: Min guard;Rolling walker (2 wheels) General ADL Comments: poor activity tolerance due to LLE - pt in tears due to pain after ambulating ~262f  with RW. Despite pain she maintained great safety and adherence of back precautions     Vision Baseline Vision/History: 0 No visual deficits Vision Assessment?: No  apparent visual deficits     Perception     Praxis      Pertinent Vitals/Pain Pain Assessment Pain Assessment: Faces Faces Pain Scale: Hurts whole lot Pain Location: LLE Pain Descriptors / Indicators: Constant, Discomfort Pain Intervention(s): Limited activity within patient's tolerance, Monitored during session     Hand Dominance Right   Extremity/Trunk Assessment Upper Extremity Assessment Upper Extremity Assessment: Overall WFL for tasks assessed   Lower Extremity Assessment Lower Extremity Assessment: Defer to PT evaluation   Cervical / Trunk Assessment Cervical / Trunk Assessment: Back Surgery   Communication Communication Communication: No difficulties   Cognition Arousal/Alertness: Awake/alert Behavior During Therapy: WFL for tasks assessed/performed Overall Cognitive Status: Within Functional Limits for tasks assessed                                 General Comments: great recall and adherence to precautions     General Comments       Exercises     Shoulder Instructions      Home Living Family/patient expects to be discharged to:: Private residence Living Arrangements: Spouse/significant other;Children;Other relatives Available Help at Discharge: Family;Available 24 hours/day Type of Home: House Home Access: Stairs to enter [employer] of Steps: 4 Entrance Stairs-Rails: Can reach both;Left;Right Home Layout: One level     Bathroom Shower/Tub: Tub/shower unit;Walk-in shower   Bathroom Toilet: Standard Bathroom Accessibility: Yes How Accessible: Accessible via walker Home Equipment: Rolling Walker (2 wheels)          Prior Functioning/Environment Prior Level of Function : Needs assist             Mobility Comments: able to get into house after d/c from surgery, but later in the afternoon Saturday had more pain L posterior thigh and could not walk or get comfortable, so spouse called ambulance ADLs Comments:  generally indep prior to back and LLE pain        OT Problem List: Pain      OT Treatment/Interventions: Self-care/ADL training;Therapeutic activities;Patient/family education;DME and/or AE instruction    OT Goals(Current goals can be found in the care plan section) Acute Rehab OT Goals Patient Stated Goal: less pain OT Goal Formulation: With patient Time For Goal Achievement: 06/26/22 Potential to Achieve Goals: Good ADL Goals Pt Will Perform Grooming: with modified independence;standing Pt Will Perform Lower Body Bathing: with supervision;sit to/from stand Pt Will Perform Lower Body Dressing: sit to/from stand;with supervision Pt Will Transfer to Toilet: with modified independence;ambulating Additional ADL Goal #1: Pt will demonstrated increased activity tolerance to complete at least 10 minues to OOB functional activity with supervision [name]  OT Frequency: Min 2X/week    Co-evaluation              AM-PAC OT "6 Clicks" Daily Activity     Outcome Measure Help from another person eating meals?: None Help from another person taking care of personal grooming?: None Help from another person toileting, which includes using toliet, bedpan, or urinal?: [name] Little Help from another person bathing (including washing, rinsing, drying)?: [name] Little Help from another person to put on and taking off regular upper body clothing?: None Help from another person to put on and taking off regular lower body clothing?: [name] Little 6 Click Score: 21  End of Session Equipment Utilized During Treatment: Rolling walker (2 wheels) Nurse Communication: Mobility status  Activity Tolerance: Patient tolerated treatment well Patient left: in bed;with call bell/phone within reach;with family/visitor present  OT Visit Diagnosis: Pain Pain - Right/Left: Right Pain - part of body: Leg                Time: 8614-8307 OT Time Calculation (min): 14 min Charges:  OT General Charges $OT Visit: 1 Visit OT  Evaluation $OT Eval Moderate Complexity: 1 Mod    [name] [name] 06/12/2022, 3:12 PM

## 2022-06-12 NOTE — Progress Notes (Signed)
  NEUROSURGERY PROGRESS NOTE   No issues overnight. Cont to c/o primarily left lateral buttock/thigh pain. Ambulating with PT. Cont to require IV pain medicine overnight.  EXAM:  BP 124/65 (BP Location: Left Arm)   Pulse 65   Temp 97.7 F (36.5 C) (Oral)   Resp 12   Ht '5\' 5"'$  (1.651 m)   Wt 60 kg   SpO2 95%   BMI 22.01 kg/m   Awake, alert, oriented  Speech fluent, appropriate  CN grossly intact  5/5 BUE 5/5 left IP, Quad, Ham, PF/DF Wound c/d/i  IMPRESSION:  56 y.o. female POD# 4 L4-S1 decompression, recovering slowly. Strength remains normal.  PLAN: - Will d/c IV pain medicine today, cont scheduled oxy 20Q4, steroid, and prn robaxin - Cont PT/OT   Consuella Lose, MD [facility]

## 2022-06-12 NOTE — Progress Notes (Signed)
Physical Therapy Treatment Patient Details Name: [name] MRN: [id number] DOB: 12/25/1965 Today's Date: 06/12/2022   History of Present Illness 56 year old female, status post L4-5, L5-S1 laminotomy/foraminotomies and L L5 microdiscectomy on 06/08/22, with now worsening left thigh pain.  PMH positive for CAD, depression, diverticulitis, GERD, HTN, DM, and seicures.    PT Comments    Patient progressing reports able to walk to station today earlier, but in pain and had to rest after.  States she has tried some of the exercises, but doesn't feel they help.  Hopeful pain improvements not due to strong pain medication.  She was able to negotiate stairs x 2 with some pain using side step technique.  Still think she may need wheelchair short term while healing her back.  PT will follow up if not d/c.    Recommendations for follow up therapy are one component of a multi-disciplinary discharge planning process, led by the attending physician.  Recommendations may be updated based on patient status, additional functional criteria and insurance authorization.  Follow Up Recommendations  Outpatient PT     Assistance Recommended at Discharge Frequent or constant Supervision/Assistance  Patient can return home with the following A lot of help with walking and/or transfers;Assistance with cooking/housework;Assist for transportation;A little help with bathing/dressing/bathroom;Help with stairs or ramp for entrance   Equipment Recommendations  Wheelchair cushion (measurements PT);Wheelchair (measurements PT)    Recommendations for Other Services       Precautions / Restrictions Precautions Precautions: Back;Fall Precaution Booklet Issued: Yes (comment) Precaution Comments: verbalized understanding Restrictions Weight Bearing Restrictions: No     Mobility  Bed Mobility   Bed Mobility: Rolling Rolling: Modified independent (Device/Increase time) Sidelying to sit: Modified independent  (Device/Increase time)       General bed mobility comments: sitting up on her own with minimal c/o pain    Transfers Overall transfer level: Needs assistance Equipment used: Rolling walker (2 wheels) Transfers: Sit to/from Stand, Bed to chair/wheelchair/BSC Sit to Stand: Supervision   Step pivot transfers: Min guard, Supervision       General transfer comment: increased time, but no physical help to rise, to step to bed versus w/c needing support for line management and cuse for technique    Ambulation/Gait Ambulation/Gait assistance: Min guard Gait Distance (Feet): 12 Feet Assistive device: Rolling walker (2 wheels) Gait Pattern/deviations: Step-through pattern, Decreased stride length, Antalgic       General Gait Details: few step w/c to stairs, then back to w/c   Stairs Stairs: Yes Stairs assistance: Min guard Stair Management: One rail Left, Step to pattern, Sideways Number of Stairs: 2 General stair comments: side steps leading with R till increased pain L side   Information systems manager mobility: Yes Wheelchair propulsion: Both upper extremities Wheelchair parts: Needs assistance Distance: 66' Wheelchair Assistance Details (indicate cue type and reason): educated on technique forward and for turning, but limited practice due to heavy chair hard to push and increases pain  Modified Rankin (Stroke Patients Only)       Balance Overall balance assessment: Needs assistance   Sitting balance-Leahy Scale: Good     Standing balance support: Bilateral upper extremity supported, Reliant on assistive device for balance Standing balance-Leahy Scale: Poor Standing balance comment: due to L LE pain                            Cognition Arousal/Alertness: Awake/alert Behavior During Therapy: WFL for tasks assessed/performed Overall Cognitive  Status: Within Functional Limits for tasks assessed                                           Exercises      General Comments General comments (skin integrity, edema, etc.): spouse in the room and supportive      Pertinent Vitals/Pain Pain Assessment Faces Pain Scale: Hurts whole lot Pain Location: LLE with stair negotiation Pain Descriptors / Indicators: Grimacing, Guarding Pain Intervention(s): Monitored during session, Limited activity within patient's tolerance, Repositioned, Ice applied    Home Living Family/patient expects to be discharged to:: Private residence Living Arrangements: Spouse/significant other;Children;Other relatives Available Help at Discharge: Family;Available 24 hours/day Type of Home: House Home Access: Stairs to enter Entrance Stairs-Rails: Can reach both;Left;Right Entrance Stairs-Number of Steps: 4   Home Layout: One level Home Equipment: Conservation officer, nature (2 wheels)      Prior Function            PT Goals (current goals can now be found in the care plan section) Progress towards PT goals: Progressing toward goals    Frequency    Min 3X/week      PT Plan Current plan remains appropriate    Co-evaluation              AM-PAC PT "6 Clicks" Mobility   Outcome Measure  Help needed turning from your back to your side while in a flat bed without using bedrails?: None Help needed moving from lying on your back to sitting on the side of a flat bed without using bedrails?: None Help needed moving to and from a bed to a chair (including a wheelchair)?: A Little Help needed standing up from a chair using your arms (e.g., wheelchair or bedside chair)?: A Little Help needed to walk in hospital room?: A Little Help needed climbing 3-5 steps with a railing? : A Little 6 Click Score: 20    End of Session   Activity Tolerance: Patient limited by pain Patient left: in bed;with call bell/phone within reach;with family/visitor present   PT Visit Diagnosis: Difficulty in walking, not elsewhere classified  (R26.2);Pain Pain - Right/Left: Left Pain - part of body: Hip     Time: 1610-9604 PT Time Calculation (min) (ACUTE ONLY): 17 min  Charges:  $Gait Training: 8-22 mins                     Magda Kiel, PT Acute Rehabilitation Services Office:[phone] 06/12/2022    Reginia Naas 06/12/2022, 5:23 PM

## 2022-06-13 ENCOUNTER — Other Ambulatory Visit (HOSPITAL_COMMUNITY): Payer: Self-pay

## 2022-06-13 NOTE — Progress Notes (Signed)
Occupational Therapy Treatment Patient Details Name: [name] MRN: [id number] DOB: 11-Sep-1966 Today's Date: 06/13/2022   History of present illness 56 year old female, status post L4-5, L5-S1 laminotomy/foraminotomies and L L5 microdiscectomy on 06/08/22, with now worsening left thigh pain.  PMH positive for CAD, depression, diverticulitis, GERD, HTN, DM, and seicures.   OT comments  [name] is progressing well with plans to d/c home today. Overall she demonstrated increased activity tolerance for transfers and functional mobility with RW. She verbalized good understanding of back precautions and compensatory techniques to utilize. Educated pt on tub transfer bench for increased independence and safety for shower transfers. Pt will continue to benefit from OT acutely. D/c recommendation is appropriate.    Recommendations for follow up therapy are one component of a multi-disciplinary discharge planning process, led by the attending physician.  Recommendations may be updated based on patient status, additional functional criteria and insurance authorization.    Follow Up Recommendations  Home health OT    Assistance Recommended at Discharge Frequent or constant Supervision/Assistance  Patient can return home with the following  A little help with walking and/or transfers;A lot of help with bathing/dressing/bathroom;Direct supervision/assist for medications management;Assist for transportation;Help with stairs or ramp for entrance   Equipment Recommendations  Other (comment)       Precautions / Restrictions Precautions Precautions: Back;Fall Precaution Booklet Issued: Yes (comment) Precaution Comments: verbalized understanding Restrictions Weight Bearing Restrictions: No       Mobility Bed Mobility Overal bed mobility: Modified Independent             General bed mobility comments: with log roll technique    Transfers Overall transfer level: Needs assistance Equipment  used: Rolling walker (2 wheels) Transfers: Sit to/from Stand Sit to Stand: Supervision                 Balance Overall balance assessment: Needs assistance Sitting-balance support: Feet supported Sitting balance-Leahy Scale: Good     Standing balance support: Single extremity supported, During functional activity Standing balance-Leahy Scale: Fair                             ADL either performed or assessed with clinical judgement   ADL Overall ADL's : Needs assistance/impaired                         Toilet Transfer: Min guard;Ambulation;Rolling walker (2 wheels)         Tub/Shower Transfer Details (indicate cue type and reason): discussed tubtransfer bench and out of pocket cost - showed pt pictures of bench and visually demonstrated transfer Functional mobility during ADLs: Min guard;Rolling walker (2 wheels) General ADL Comments: increased activity tolerance this date, tolerating >household ambulation distance    Extremity/Trunk Assessment Upper Extremity Assessment Upper Extremity Assessment: Overall WFL for tasks assessed   Lower Extremity Assessment Lower Extremity Assessment: Defer to PT evaluation        Vision   Vision Assessment?: No apparent visual deficits   Perception Perception Perception: Not tested   Praxis Praxis Praxis: Not tested    Cognition Arousal/Alertness: Awake/alert Behavior During Therapy: WFL for tasks assessed/performed Overall Cognitive Status: Within Functional Limits for tasks assessed                                 General Comments: eager to go home today  General Comments VSS on RA, no family present    Pertinent Vitals/ Pain       Pain Assessment Pain Assessment: Faces Faces Pain Scale: Hurts little more Pain Location: back Pain Descriptors / Indicators: Grimacing, Guarding Pain Intervention(s): Limited activity within patient's tolerance   Frequency  Min  2X/week        Progress Toward Goals  OT Goals(current goals can now be found in the care plan section)  Progress towards OT goals: Progressing toward goals  Acute Rehab OT Goals Patient Stated Goal: home today OT Goal Formulation: With patient Time For Goal Achievement: 06/26/22 Potential to Achieve Goals: Good ADL Goals Pt Will Perform Grooming: with modified independence;standing Pt Will Perform Lower Body Bathing: with supervision;sit to/from stand Pt Will Perform Lower Body Dressing: sit to/from stand;with supervision Pt Will Transfer to Toilet: with modified independence;ambulating Additional ADL Goal #1: Pt will demonstrated increased activity tolerance to complete at least 10 minues to OOB functional activity with supervision A  Plan Discharge plan remains appropriate;All goals met and education completed, patient discharged from OT services    Co-evaluation                 AM-PAC OT "6 Clicks" Daily Activity     Outcome Measure   Help from another person eating meals?: None Help from another person taking care of personal grooming?: None Help from another person toileting, which includes using toliet, bedpan, or urinal?: A Little Help from another person bathing (including washing, rinsing, drying)?: A Little Help from another person to put on and taking off regular upper body clothing?: None Help from another person to put on and taking off regular lower body clothing?: A Little 6 Click Score: 21    End of Session Equipment Utilized During Treatment: Rolling walker (2 wheels)  OT Visit Diagnosis: Pain Pain - Right/Left: Right Pain - part of body: Leg   Activity Tolerance Patient tolerated treatment well   Patient Left in bed;with call bell/phone within reach;with nursing/sitter in room   Nurse Communication Mobility status        Time: 8527-7824 OT Time Calculation (min): 16 min  Charges: OT General Charges $OT Visit: 1 Visit OT  Treatments $Therapeutic Activity: 8-22 mins    [name] A [name] 06/13/2022, 2:06 PM

## 2022-06-13 NOTE — Discharge Summary (Signed)
Physician Discharge Summary     Providing Compassionate, Quality Care - Together   Patient ID: [name] MRN: [id number] DOB/AGE: Mar 31, 1966 56 y.o.  Admit date: 06/10/2022 Discharge date: 06/13/2022  Admission Diagnoses: Lumbar radiculopathy  Discharge Diagnoses:  Principal Problem:   Lumbar radiculopathy   Discharged Condition: good  Hospital Course: Patient underwent a left L4-5 decompressive laminotomy and foraminotomies, with left L5-S1 decompressive laminotomy/foraminotomies and left L5 microdiscectomy by Dr. Annette Stable on 06/08/2022. She was discharged home on 06/09/2022, but returned on 06/10/2022 due to uncontrolled pain. She is doing much better now. She is ambulating with the aid of a walker, but will need a wheelchair for going to appointments. She is tolerating a normal diet. She is not having any bowel or bladder dysfunction. Her pain is well-controlled with oral pain medication. She is ready for discharge home with outpatient PT.   Consults: PT/OT    Treatments: pain management  Discharge Exam: Blood pressure (!) 147/78, pulse 79, temperature 98 F (36.7 C), temperature source Oral, resp. rate 18, height $RemoveBe'5\' 5"'vzGaotLvC$  (1.651 m), weight 60 kg, SpO2 97 %.  Alert and oriented x 4 PERRLA CN II-XII grossly intact MAE, Strength and sensation intact  Disposition: Discharge disposition: 01-Home or Self Care       Discharge Instructions     Ambulatory referral to Physical Therapy   Complete by: As directed    Call MD for:  persistant nausea and vomiting   Complete by: As directed    Call MD for:  redness, tenderness, or signs of infection (pain, swelling, redness, odor or green/yellow discharge around incision site)   Complete by: As directed    Call MD for:  severe uncontrolled pain   Complete by: As directed    Call MD for:  temperature >100.4   Complete by: As directed    Diet - low sodium heart healthy   Complete by: As directed    For home use only DME standard  manual wheelchair with seat cushion   Complete by: As directed    Patient suffers from spinal stenosis which impairs their ability to perform daily activities like going to appointments.  A walker will not resolve issue with performing activities of daily living. A wheelchair will allow patient to safely perform daily activities. Patient can safely propel the wheelchair in the home or has a caregiver who can provide assistance. Length of need 6 months . Accessories: elevating leg rests (ELRs), wheel locks, extensions and anti-tippers.   Increase activity slowly   Complete by: As directed    No dressing needed   Complete by: As directed    No wound care   Complete by: As directed       Allergies as of 06/13/2022       Reactions   Dilaudid [hydromorphone Hcl] Anxiety   Phenytoin Sodium Extended Anxiety        Medication List     TAKE these medications    aspirin EC 81 MG tablet Take 81 mg by mouth daily. Swallow whole.   atorvastatin 80 MG tablet Commonly known as: LIPITOR Take 80 mg by mouth daily.   carvedilol 6.25 MG tablet Commonly known as: COREG Take 6.25 mg by mouth every evening.   esomeprazole 40 MG capsule Commonly known as: NEXIUM Take 40 mg by mouth 2 (two) times daily before a meal.   estradiol 1 MG tablet Commonly known as: ESTRACE Take 1 mg by mouth daily.   gabapentin 300 MG capsule Commonly known  as: Neurontin Start with 300 mg daily for 2 days then go to 300 mg twice per day for 2 days and then go to 300 mg 3 times per day and then discussed with your neurosurgeon   hydrOXYzine 25 MG tablet Commonly known as: ATARAX Take 25 mg by mouth daily.   icosapent Ethyl 1 g capsule Commonly known as: VASCEPA Take 1 g by mouth 2 (two) times daily.   lamoTRIgine 25 MG tablet Commonly known as: LAMICTAL Take 25 mg by mouth 2 (two) times daily.   lisinopril-hydrochlorothiazide 10-12.5 MG tablet Commonly known as: ZESTORETIC Take 1 tablet by mouth  daily.   methocarbamol 750 MG tablet Commonly known as: Robaxin-750 Take 1 tablet (750 mg total) by mouth every 6 (six) hours as needed for muscle spasms.   methylPREDNISolone 4 MG Tbpk tablet Commonly known as: MEDROL DOSEPAK Take per package instructions   montelukast 10 MG tablet Commonly known as: SINGULAIR Take 10 mg by mouth at bedtime.   Narcan 4 MG/0.1ML Liqd nasal spray kit Generic drug: naloxone Place 4 mg into the nose once as needed (For overdose).   Oxycodone HCl 20 MG Tabs Take 1 tablet by mouth every 4 (four) hours as needed (pain from brain surgery).   Restasis 0.05 % ophthalmic emulsion Generic drug: cycloSPORINE Place 1 drop into both eyes daily.   sertraline 100 MG tablet Commonly known as: ZOLOFT Take 100 mg by mouth 2 (two) times daily.   vitamin B-12 1000 MCG tablet Commonly known as: CYANOCOBALAMIN Take 1,000 mcg by mouth daily.               Durable Medical Equipment  (From admission, onward)           Start     Ordered   06/13/22 0000  For home use only DME standard manual wheelchair with seat cushion       Comments: Patient suffers from spinal stenosis which impairs their ability to perform daily activities like going to appointments.  A walker will not resolve issue with performing activities of daily living. A wheelchair will allow patient to safely perform daily activities. Patient can safely propel the wheelchair in the home or has a caregiver who can provide assistance. Length of need 6 months . Accessories: elevating leg rests (ELRs), wheel locks, extensions and anti-tippers.   06/13/22 1227              Discharge Care Instructions  (From admission, onward)           Start     Ordered   06/13/22 0000  No dressing needed        06/13/22 1227            Follow-up Information     Earnie Larsson, MD. Go on 06/19/2022.   Specialty: Neurosurgery Why: First post op appointment is on 06/19/2022 at 3:00 pm. Contact  information: [address] [place] [address] [place] .   Specialty: Emergency Medicine Why: If symptoms worsen Contact information: [address] [id number] [place] [phone]                Signed: Viona Gilmore, DNP, AGNP-C Nurse Practitioner  [facility] [place] [address], [address], [place], [place] [address] P: [phone]    F: [phone]  06/13/2022, 12:31 PM

## 2022-06-13 NOTE — Progress Notes (Signed)
Physical Therapy Treatment Patient Details Name: [name] MRN: [id number] DOB: 11-May-1966 Today's Date: 06/13/2022   History of Present Illness 56 year old female, status post L4-5, L5-S1 laminotomy/foraminotomies and L L5 microdiscectomy on 06/08/22, with now worsening left thigh pain.  PMH positive for CAD, depression, diverticulitis, GERD, HTN, DM, and seicures.    PT Comments    Pt is demonstrating improved pain and activity tolerance today, ambulating up to ~120 ft with a RW at a supervision level. Pt still displays a slow, antalgic gait pattern, but no LOB noted. Educated pt on car transfers, mobility progress/frequency, and to have assistance on stairs for safety at home. Pt requesting w/c for community distance mobility, which is recommended at this time. Will continue to follow acutely. Current recommendations remain appropriate.    Recommendations for follow up therapy are one component of a multi-disciplinary discharge planning process, led by the attending physician.  Recommendations may be updated based on patient status, additional functional criteria and insurance authorization.  Follow Up Recommendations  Outpatient PT     Assistance Recommended at Discharge Frequent or constant Supervision/Assistance  Patient can return home with the following Assistance with cooking/housework;Assist for transportation;A little help with bathing/dressing/bathroom;Help with stairs or ramp for entrance;A little help with walking and/or transfers   Equipment Recommendations  Wheelchair cushion (measurements PT);Wheelchair (measurements PT)    Recommendations for Other Services       Precautions / Restrictions Precautions Precautions: Back;Fall Precaution Booklet Issued: Yes (comment) Precaution Comments: verbalized understanding, needs cues to recall "BLT" though Restrictions Weight Bearing Restrictions: No     Mobility  Bed Mobility Overal bed mobility: Modified  Independent Bed Mobility: Rolling, Sidelying to Sit Rolling: Modified independent (Device/Increase time) Sidelying to sit: Modified independent (Device/Increase time)       General bed mobility comments: Mod I using bed rail to roll and sit up R EOB.    Transfers Overall transfer level: Needs assistance Equipment used: Rolling walker (2 wheels) Transfers: Sit to/from Stand Sit to Stand: Supervision           General transfer comment: Cues for hand placement on bed with sit <> stand for better control and safety, no LOB, supervision for safety.    Ambulation/Gait Ambulation/Gait assistance: Supervision Gait Distance (Feet): 120 Feet Assistive device: Rolling walker (2 wheels) Gait Pattern/deviations: Step-through pattern, Decreased stride length, Antalgic Gait velocity: reduced Gait velocity interpretation: <1.31 ft/sec, indicative of household ambulator   General Gait Details: Pt with slow, stiff gait with shoulders elevated. Cues provided to relax shoulders with min success. No LOB, supervision for safety.   Stairs             Wheelchair Mobility    Modified Rankin (Stroke Patients Only)       Balance Overall balance assessment: Needs assistance Sitting-balance support: Feet supported Sitting balance-Leahy Scale: Good     Standing balance support: Bilateral upper extremity supported, During functional activity, Reliant on assistive device for balance Standing balance-Leahy Scale: Poor Standing balance comment: Reliant on RW for mobility                            Cognition Arousal/Alertness: Awake/alert Behavior During Therapy: WFL for tasks assessed/performed Overall Cognitive Status: Within Functional Limits for tasks assessed  Exercises      General Comments General comments (skin integrity, edema, etc.): educated pt to have assistance for safety on stairs, mobility  progression/frequency, and car transfers      Pertinent Vitals/Pain Pain Assessment Pain Assessment: Faces Faces Pain Scale: Hurts little more Pain Location: back Pain Descriptors / Indicators: Grimacing, Guarding Pain Intervention(s): Limited activity within patient's tolerance, Monitored during session, Repositioned    Home Living                          Prior Function            PT Goals (current goals can now be found in the care plan section) Acute Rehab PT Goals Patient Stated Goal: to go home today PT Goal Formulation: With patient Time For Goal Achievement: 06/18/22 Potential to Achieve Goals: Good Progress towards PT goals: Progressing toward goals    Frequency    Min 3X/week      PT Plan Current plan remains appropriate    Co-evaluation              AM-PAC PT "6 Clicks" Mobility   Outcome Measure  Help needed turning from your back to your side while in a flat bed without using bedrails?: None Help needed moving from lying on your back to sitting on the side of a flat bed without using bedrails?: None Help needed moving to and from a bed to a chair (including a wheelchair)?: A Little Help needed standing up from a chair using your arms (e.g., wheelchair or bedside chair)?: A Little Help needed to walk in hospital room?: A Little Help needed climbing 3-5 steps with a railing? : A Little 6 Click Score: 20    End of Session   Activity Tolerance: Patient tolerated treatment well Patient left: in bed;with call bell/phone within reach   PT Visit Diagnosis: Difficulty in walking, not elsewhere classified (R26.2);Pain;Unsteadiness on feet (R26.81);Other abnormalities of gait and mobility (R26.89) Pain - Right/Left: Left Pain - part of body: Hip     Time: 3532-9924 PT Time Calculation (min) (ACUTE ONLY): 8 min  Charges:  $Gait Training: 8-22 mins                     Moishe Spice, PT, DPT Acute Rehabilitation Services  Office:  [place] 06/13/2022, 9:54 AM

## 2022-06-13 NOTE — TOC Progression Note (Addendum)
Transition of Care [facility]) - Progression Note    Patient Details  Name: [name] MRN: [id number] Date of Birth: [birth year]  Transition of Care [facility]) CM/SW [place], RN Phone Number:[phone]  06/13/2022, 10:06 AM  Clinical Narrative:    TOC acknowledges recommendations for outpatient PT. CM at bedside spoke with patient who is agreeable with outpatient PT. MD will need to enter outpatient PT orders and DME orders.   1330 DME wheelchair ordered per [employer] and to be delivered to the bedside. Ambulatory referral  for PT has been entered per MD.  1524 [employer] called to say that wheelchair would be delivered. Patient has already left facility without wheelchair. Wheelchair to be shipped to patients home per [employer].          Expected Discharge Plan and Services                                                 Social Determinants of Health (SDOH) Interventions    Readmission Risk Interventions     No data to display

## 2022-06-13 NOTE — Progress Notes (Signed)
Spoke to patient regarding leaving without receiving her wheelchair that has been ordered for her. Explained that we are concerned for her safety and being able to safely maneuver through out her house and from the driveway. She explained that "she did not want to wait any longer and that she would be just fine with her walker and the help of her husband". She also stated that if we would call her when it is delivered her brother whom lives in [place] can come pick it up and bring it to her home. Patient has been discharged and understands DC instructions.

## 2022-09-10 ENCOUNTER — Other Ambulatory Visit: Payer: Self-pay | Admitting: Neurosurgery

## 2022-09-10 DIAGNOSIS — M5416 Radiculopathy, lumbar region: Secondary | ICD-10-CM

## 2022-09-14 ENCOUNTER — Other Ambulatory Visit (HOSPITAL_COMMUNITY): Payer: Self-pay | Admitting: Emergency Medicine

## 2022-09-29 ENCOUNTER — Ambulatory Visit
Admission: RE | Admit: 2022-09-29 | Discharge: 2022-09-29 | Disposition: A | Discharge status: Home / Self Care | Payer: Medicaid Other | Source: Ambulatory Visit | Attending: Neurosurgery | Admitting: Neurosurgery

## 2022-09-29 DIAGNOSIS — M5416 Radiculopathy, lumbar region: Secondary | ICD-10-CM

## 2022-10-05 ENCOUNTER — Other Ambulatory Visit (HOSPITAL_COMMUNITY): Payer: Self-pay

## 2022-11-19 ENCOUNTER — Other Ambulatory Visit (HOSPITAL_COMMUNITY): Payer: Self-pay | Admitting: Emergency Medicine

## 2022-11-29 ENCOUNTER — Other Ambulatory Visit (HOSPITAL_COMMUNITY): Payer: Self-pay

## 2023-04-16 ENCOUNTER — Other Ambulatory Visit: Payer: Self-pay | Admitting: Internal Medicine

## 2023-04-16 DIAGNOSIS — Z1231 Encounter for screening mammogram for malignant neoplasm of breast: Secondary | ICD-10-CM

## 2023-04-24 DIAGNOSIS — Z1231 Encounter for screening mammogram for malignant neoplasm of breast: Secondary | ICD-10-CM

## 2023-06-03 ENCOUNTER — Other Ambulatory Visit: Payer: Self-pay | Admitting: Student

## 2023-06-03 DIAGNOSIS — M5416 Radiculopathy, lumbar region: Secondary | ICD-10-CM

## 2023-06-29 ENCOUNTER — Ambulatory Visit
Admission: RE | Admit: 2023-06-29 | Discharge: 2023-06-29 | Disposition: A | Discharge status: Home / Self Care | Payer: Medicaid Other | Source: Ambulatory Visit | Attending: Student | Admitting: Student

## 2023-06-29 DIAGNOSIS — M5416 Radiculopathy, lumbar region: Secondary | ICD-10-CM

## 2023-12-18 ENCOUNTER — Emergency Department (HOSPITAL_BASED_OUTPATIENT_CLINIC_OR_DEPARTMENT_OTHER): Payer: Medicaid Other

## 2023-12-18 ENCOUNTER — Emergency Department (HOSPITAL_BASED_OUTPATIENT_CLINIC_OR_DEPARTMENT_OTHER)
Admission: EM | Admit: 2023-12-18 | Discharge: 2023-12-18 | Disposition: A | Discharge status: Home / Self Care | Payer: Medicaid Other | Attending: Emergency Medicine | Admitting: Emergency Medicine

## 2023-12-18 ENCOUNTER — Encounter (HOSPITAL_BASED_OUTPATIENT_CLINIC_OR_DEPARTMENT_OTHER): Payer: Self-pay | Admitting: Emergency Medicine

## 2023-12-18 ENCOUNTER — Other Ambulatory Visit (HOSPITAL_BASED_OUTPATIENT_CLINIC_OR_DEPARTMENT_OTHER): Payer: Self-pay

## 2023-12-18 ENCOUNTER — Other Ambulatory Visit: Payer: Self-pay

## 2023-12-18 DIAGNOSIS — R509 Fever, unspecified: Secondary | ICD-10-CM | POA: Diagnosis not present

## 2023-12-18 DIAGNOSIS — Z20822 Contact with and (suspected) exposure to covid-19: Secondary | ICD-10-CM | POA: Insufficient documentation

## 2023-12-18 DIAGNOSIS — R1032 Left lower quadrant pain: Secondary | ICD-10-CM | POA: Diagnosis present

## 2023-12-18 DIAGNOSIS — K5792 Diverticulitis of intestine, part unspecified, without perforation or abscess without bleeding: Secondary | ICD-10-CM

## 2023-12-18 DIAGNOSIS — D72829 Elevated white blood cell count, unspecified: Secondary | ICD-10-CM | POA: Diagnosis not present

## 2023-12-18 DIAGNOSIS — K5732 Diverticulitis of large intestine without perforation or abscess without bleeding: Secondary | ICD-10-CM | POA: Diagnosis not present

## 2023-12-18 DIAGNOSIS — Z7982 Long term (current) use of aspirin: Secondary | ICD-10-CM | POA: Insufficient documentation

## 2023-12-18 LAB — LIPASE, BLOOD: Lipase: 27 U/L (ref 11–51)

## 2023-12-18 LAB — URINALYSIS, ROUTINE W REFLEX MICROSCOPIC
Bilirubin Urine: NEGATIVE
Glucose, UA: NEGATIVE mg/dL
Hgb urine dipstick: NEGATIVE
Ketones, ur: NEGATIVE mg/dL
Leukocytes,Ua: NEGATIVE
Nitrite: NEGATIVE
Protein, ur: 30 mg/dL — AB
Specific Gravity, Urine: 1.028 (ref 1.005–1.030)
pH: 5.5 (ref 5.0–8.0)

## 2023-12-18 LAB — COMPREHENSIVE METABOLIC PANEL
ALT: 19 U/L (ref 0–44)
AST: 17 U/L (ref 15–41)
Albumin: 4.6 g/dL (ref 3.5–5.0)
Alkaline Phosphatase: 98 U/L (ref 38–126)
Anion gap: 8 (ref 5–15)
BUN: 12 mg/dL (ref 6–20)
CO2: 28 mmol/L (ref 22–32)
Calcium: 9.7 mg/dL (ref 8.9–10.3)
Chloride: 94 mmol/L — ABNORMAL LOW (ref 98–111)
Creatinine, Ser: 0.75 mg/dL (ref 0.44–1.00)
GFR, Estimated: >60 mL/min (ref >60)
Glucose, Bld: 140 mg/dL — ABNORMAL HIGH (ref 70–99)
Potassium: 4 mmol/L (ref 3.5–5.1)
Sodium: 130 mmol/L — ABNORMAL LOW (ref 135–145)
Total Bilirubin: 1.1 mg/dL (ref 0.0–1.2)
Total Protein: 8.1 g/dL (ref 6.5–8.1)

## 2023-12-18 LAB — CBC
HCT: 39.2 % (ref 36.0–46.0)
Hemoglobin: 13.3 g/dL (ref 12.0–15.0)
MCH: 27.5 pg (ref 26.0–34.0)
MCHC: 33.9 g/dL (ref 30.0–36.0)
MCV: 81.2 fL (ref 80.0–100.0)
Platelets: 258 10*3/uL (ref 150–400)
RBC: 4.83 MIL/uL (ref 3.87–5.11)
RDW: 13.5 % (ref 11.5–15.5)
WBC: 11.6 10*3/uL — ABNORMAL HIGH (ref 4.0–10.5)
nRBC: 0 % (ref 0.0–0.2)

## 2023-12-18 LAB — RESP PANEL BY RT-PCR (RSV, FLU A&B, COVID)  RVPGX2
Influenza A by PCR: NEGATIVE
Influenza B by PCR: NEGATIVE
Resp Syncytial Virus by PCR: NEGATIVE
SARS Coronavirus 2 by RT PCR: NEGATIVE

## 2023-12-18 MED ORDER — ONDANSETRON HCL 4 MG/2ML IJ SOLN
4.0000 mg | Freq: Once | INTRAMUSCULAR | Status: AC
  Administered 2023-12-18: 4 mg via INTRAVENOUS
  Filled 2023-12-18: qty 2

## 2023-12-18 MED ORDER — METRONIDAZOLE 500 MG PO TABS
500.0000 mg | ORAL_TABLET | Freq: Two times a day (BID) | ORAL | 0 refills | Status: AC
  Filled 2023-12-18: qty 14, 7d supply, fill #0

## 2023-12-18 MED ORDER — CIPROFLOXACIN HCL 500 MG PO TABS
500.0000 mg | ORAL_TABLET | Freq: Once | ORAL | Status: AC
  Administered 2023-12-18: 500 mg via ORAL
  Filled 2023-12-18: qty 1

## 2023-12-18 MED ORDER — IOHEXOL 300 MG/ML  SOLN
100.0000 mL | Freq: Once | INTRAMUSCULAR | Status: AC | PRN
  Administered 2023-12-18: 100 mL via INTRAVENOUS

## 2023-12-18 MED ORDER — METRONIDAZOLE 500 MG PO TABS
500.0000 mg | ORAL_TABLET | Freq: Once | ORAL | Status: AC
  Administered 2023-12-18: 500 mg via ORAL
  Filled 2023-12-18: qty 1

## 2023-12-18 MED ORDER — ONDANSETRON 4 MG PO TBDP
4.0000 mg | ORAL_TABLET | ORAL | 0 refills | Status: AC | PRN
  Filled 2023-12-18: qty 8, 2d supply, fill #0

## 2023-12-18 MED ORDER — SODIUM CHLORIDE 0.9 % IV BOLUS
1000.0000 mL | Freq: Once | INTRAVENOUS | Status: AC
  Administered 2023-12-18: 1000 mL via INTRAVENOUS

## 2023-12-18 MED ORDER — FENTANYL CITRATE PF 50 MCG/ML IJ SOSY
50.0000 ug | PREFILLED_SYRINGE | Freq: Once | INTRAMUSCULAR | Status: AC
  Administered 2023-12-18: 50 ug via INTRAVENOUS
  Filled 2023-12-18: qty 1

## 2023-12-18 MED ORDER — CIPROFLOXACIN HCL 500 MG PO TABS
500.0000 mg | ORAL_TABLET | Freq: Two times a day (BID) | ORAL | 0 refills | Status: AC
  Filled 2023-12-18: qty 14, 7d supply, fill #0

## 2023-12-18 NOTE — ED Notes (Signed)
 RN reviewed discharge instructions with pt. Pt verbalized understanding and had no further questions. VSS upon discharge.

## 2023-12-18 NOTE — ED Triage Notes (Signed)
 Pt caox4, ambulatory c/o LLQ abd pain, chills x3 days, denies N/V/D, pt denies urinary s/s. Pt reports PMH diverticulitis multiple times but states this pain is "much worse."   Last had oxycodone at 9a with no relief.

## 2023-12-18 NOTE — ED Notes (Signed)
 RT Note: Patient had to be placed on 2lpm [place] due to her oxygen saturation decreasing to 89% om room air. Currently her oxygen saturation is 98% 2lpm [place]

## 2023-12-18 NOTE — ED Notes (Signed)
 Placed on O2 @ 2 LPM via [place] due to decreasing SPO2 from pain medication administration.

## 2023-12-18 NOTE — ED Provider Notes (Signed)
 [place] [facility] AT [facility] Provider Note   CSN: [id number] Arrival date & time: 12/18/23  1013     History  Chief Complaint  Patient presents with   Abdominal Pain    [name] is a 58 y.o. female.  Patient is a 58 year old who presents with abdominal pain.  She has had a prior history of diverticulitis.  She has had some worsening pain in her left lower abdomen for the last 3 days.  She has had some nausea but no vomiting.  No change in her stools.  No diarrhea/constipation.  She has had some fevers up to 102.  No urinary symptoms.       Home Medications Prior to Admission medications   Medication Sig Start Date End Date Taking? Authorizing Provider  ciprofloxacin  (CIPRO ) 500 MG tablet Take 1 tablet (500 mg total) by mouth every 12 (twelve) hours. 12/18/23  Yes Lenor Hollering, MD  metroNIDAZOLE  (FLAGYL ) 500 MG tablet Take 1 tablet (500 mg total) by mouth 2 (two) times daily. 12/18/23  Yes Lenor Hollering, MD  ondansetron  (ZOFRAN -ODT) 4 MG disintegrating tablet 4mg  ODT q4 hours prn nausea/vomit 12/18/23  Yes Lenor Hollering, MD  aspirin  EC 81 MG tablet Take 81 mg by mouth daily. Swallow whole.    [provider]  atorvastatin  (LIPITOR ) 80 MG tablet Take 80 mg by mouth daily. 06/12/22   [provider]  carvedilol  (COREG ) 6.25 MG tablet Take 6.25 mg by mouth every evening.    [provider]  esomeprazole (NEXIUM) 40 MG capsule Take 40 mg by mouth 2 (two) times daily before a meal.    [provider]  estradiol  (ESTRACE ) 1 MG tablet Take 1 mg by mouth daily.    [provider]  gabapentin  (NEURONTIN ) 300 MG capsule Start with 300 mg daily for 2 days then go to 300 mg twice per day for 2 days and then go to 300 mg 3 times per day and then discussed with your neurosurgeon 06/10/22   Freddi Hamilton, MD  hydrOXYzine  (ATARAX ) 25 MG tablet Take 25 mg by mouth daily. 04/02/22   [provider]  icosapent  Ethyl  (VASCEPA ) 1 g capsule Take 1 g by mouth 2 (two) times daily.    [provider]  lamoTRIgine  (LAMICTAL ) 25 MG tablet Take 25 mg by mouth 2 (two) times daily. 05/16/22   [provider]  lisinopril -hydrochlorothiazide  (ZESTORETIC ) 10-12.5 MG tablet Take 1 tablet by mouth daily.    [provider]  methocarbamol  (ROBAXIN -750) 750 MG tablet Take 1 tablet (750 mg total) by mouth every 6 (six) hours as needed for muscle spasms. 06/09/22   Arvil Fonda CROME, NP  methylPREDNISolone  (MEDROL  DOSEPAK) 4 MG TBPK tablet Take per package instructions 06/10/22   Freddi Hamilton, MD  montelukast  (SINGULAIR ) 10 MG tablet Take 10 mg by mouth at bedtime.    [provider]  naloxone [facility]) nasal spray 4 mg/0.1 mL Place 4 mg into the nose once as needed (For overdose). 12/19/20   [provider]  Oxycodone  HCl 20 MG TABS Take 1 tablet by mouth every 4 (four) hours as needed (pain from brain surgery).    [provider]  RESTASIS  0.05 % ophthalmic emulsion Place 1 drop into both eyes daily. 04/10/22   [provider]  sertraline  (ZOLOFT ) 100 MG tablet Take 100 mg by mouth 2 (two) times daily.    [provider]  vitamin B-12 (CYANOCOBALAMIN ) 1000 MCG tablet Take 1,000 mcg by mouth  daily.    [provider]      Allergies    Dilaudid  [hydromorphone  hcl] and Phenytoin sodium extended    Review of Systems   Review of Systems  Constitutional:  Negative for chills, diaphoresis, fatigue and fever.  HENT:  Negative for congestion, rhinorrhea and sneezing.   Eyes: Negative.   Respiratory:  Negative for cough, chest tightness and shortness of breath.   Cardiovascular:  Negative for chest pain and leg swelling.  Gastrointestinal:  Positive for abdominal pain and nausea. Negative for blood in stool, diarrhea and vomiting.  Genitourinary:  Negative for difficulty urinating, flank pain, frequency and hematuria.  Musculoskeletal:  Negative for  arthralgias and back pain.  Skin:  Negative for rash.  Neurological:  Negative for dizziness, speech difficulty, weakness, numbness and headaches.    Physical Exam Updated Vital Signs BP 112/82   Pulse (!) 101   Temp 98.3 F (36.8 C) (Oral)   Resp 14   Ht 5' 6 (1.676 m)   Wt 108 kg   SpO2 97%   BMI 38.41 kg/m  Physical Exam Constitutional:      Appearance: She is well-developed.  HENT:     Head: Normocephalic and atraumatic.  Eyes:     Pupils: Pupils are equal, round, and reactive to light.  Cardiovascular:     Rate and Rhythm: Normal rate and regular rhythm.     Heart sounds: Normal heart sounds.  Pulmonary:     Effort: Pulmonary effort is normal. No respiratory distress.     Breath sounds: Normal breath sounds. No wheezing or rales.  Chest:     Chest wall: No tenderness.  Abdominal:     General: Bowel sounds are normal.     Palpations: Abdomen is soft.     Tenderness: There is abdominal tenderness in the periumbilical area and left lower quadrant. There is no guarding or rebound.  Musculoskeletal:        General: Normal range of motion.     Cervical back: Normal range of motion and neck supple.  Lymphadenopathy:     Cervical: No cervical adenopathy.  Skin:    General: Skin is warm and dry.     Findings: No rash.  Neurological:     Mental Status: She is alert and oriented to person, place, and time.     ED Results / Procedures / Treatments   Labs (all labs ordered are listed, but only abnormal results are displayed) Labs Reviewed  COMPREHENSIVE METABOLIC PANEL - Abnormal; Notable for the following components:      Result Value   Sodium 130 (*)    Chloride 94 (*)    Glucose, Bld 140 (*)    All other components within normal limits  CBC - Abnormal; Notable for the following components:   WBC 11.6 (*)    All other components within normal limits  URINALYSIS, ROUTINE W REFLEX MICROSCOPIC - Abnormal; Notable for the following components:   APPearance HAZY  (*)    Protein, ur 30 (*)    Bacteria, UA FEW (*)    All other components within normal limits  RESP PANEL BY RT-PCR (RSV, FLU A&B, COVID)  [id number]  LIPASE, BLOOD    EKG EKG Interpretation Date/Time:  Wednesday December 18 2023 11:52:10 EST Ventricular Rate:  106 PR Interval:  138 QRS Duration:  104 QT Interval:  356 QTC Calculation: 473 R Axis:   78  Text Interpretation: Age not entered, assumed to be  58 years old  for purpose of ECG interpretation Sinus tachycardia Low voltage, precordial leads SINCE LAST TRACING HEART RATE HAS INCREASED Confirmed by Lenor Hollering [phone]) on 12/18/2023 12:05:17 PM  Radiology CT ABDOMEN PELVIS W CONTRAST Result Date: 12/18/2023 CLINICAL DATA:  Left lower quadrant pain and chills for several days. Prior diverticulitis. EXAM: CT ABDOMEN AND PELVIS WITH CONTRAST TECHNIQUE: Multidetector CT imaging of the abdomen and pelvis was performed using the standard protocol following bolus administration of intravenous contrast. RADIATION DOSE REDUCTION: This exam was performed according to the departmental dose-optimization program which includes automated exposure control, adjustment of the mA and/or kV according to patient size and/or use of iterative reconstruction technique. CONTRAST:  OMNIPAQUE  IOHEXOL  300 MG/ML  SOLN COMPARISON:  03/10/2012 FINDINGS: Lower Chest: No acute findings. Hepatobiliary: No suspicious hepatic masses identified. Mild diffuse hepatic steatosis again noted. Prior cholecystectomy. No evidence of biliary obstruction. Pancreas:  No mass or inflammatory changes. Spleen: Within normal limits in size and appearance. Adrenals/Urinary Tract: No suspicious masses identified. No evidence of ureteral calculi or hydronephrosis. Stomach/Bowel: Mild-to-moderate acute diverticulitis is seen involving the sigmoid colon. No evidence of perforation or abscess. Vascular/Lymphatic: No pathologically enlarged lymph nodes. No acute vascular findings.  Reproductive: Prior hysterectomy noted. Adnexal regions are unremarkable in appearance. Other:  None. Musculoskeletal:  No suspicious bone lesions identified. IMPRESSION: Mild-to-moderate acute sigmoid diverticulitis. No evidence of perforation or abscess. Mild hepatic steatosis. Electronically Signed   By: Norleen DELENA Kil M.D.   On: 12/18/2023 12:56    Procedures Procedures    Medications Ordered in ED Medications  ciprofloxacin  (CIPRO ) tablet 500 mg (has no administration in time range)  metroNIDAZOLE  (FLAGYL ) tablet 500 mg (has no administration in time range)  sodium chloride  0.9 % bolus 1,000 mL (0 mLs Intravenous Stopped 12/18/23 1246)  fentaNYL  (SUBLIMAZE ) injection 50 mcg (50 mcg Intravenous Given 12/18/23 1123)  ondansetron  (ZOFRAN ) injection 4 mg (4 mg Intravenous Given 12/18/23 1123)  iohexol  (OMNIPAQUE ) 300 MG/ML solution 100 mL (100 mLs Intravenous Contrast Given 12/18/23 1155)  sodium chloride  0.9 % bolus 1,000 mL (1,000 mLs Intravenous New Bag/Given 12/18/23 1440)    ED Course/ Medical Decision Making/ A&P                                 Medical Decision Making Amount and/or Complexity of Data Reviewed Labs: ordered. Radiology: ordered.  Risk Prescription drug management.   Patient is a 58 year old who presents with abdominal pain.  Her WBC count is mildly elevated.  She is afebrile.  She was tachycardic on initial exam.  She was given IV fluids and some pain medication.  Her urine is not consistent with infection.  Her glucose is mildly elevated.  CT scan shows evidence of diverticulitis.  There is no complicating features such as perforation or abscess.  Her tachycardia has resolved.  Her heart rate was 95 on reevaluation.  She still has some tenderness on exam but says she feels well enough to go home.  She did not want any further pain medication in the ED because she has to drive.  She was started on Cipro  and Flagyl .  She was discharged home in good condition.  She was given  prescriptions for Cipro , Flagyl  and Zofran .  On chart review, it looks like she gets chronic pain medication prescriptions and was last prescribed a 30-day supply on December 19 so she was not given any further opioid medications.  Return precautions were given.  Final Clinical Impression(s) / ED Diagnoses Final diagnoses:  Diverticulitis    Rx / DC Orders ED Discharge Orders          Ordered    ciprofloxacin  (CIPRO ) 500 MG tablet  Every 12 hours        12/18/23 1450    metroNIDAZOLE  (FLAGYL ) 500 MG tablet  2 times daily        12/18/23 1450    ondansetron  (ZOFRAN -ODT) 4 MG disintegrating tablet        12/18/23 1450              Lenor Hollering, MD 12/18/23 1452
